# Patient Record
Sex: Female | Born: 1935
Health system: Southern US, Community
[De-identification: ages and names within clinical notes are randomized; demographics above are authoritative.]

## PROBLEM LIST (undated history)

## (undated) DIAGNOSIS — M810 Age-related osteoporosis without current pathological fracture: Secondary | ICD-10-CM

## (undated) DIAGNOSIS — H5060 Mechanical strabismus, unspecified: Secondary | ICD-10-CM

## (undated) DIAGNOSIS — I495 Sick sinus syndrome: Secondary | ICD-10-CM

## (undated) DIAGNOSIS — K449 Diaphragmatic hernia without obstruction or gangrene: Secondary | ICD-10-CM

## (undated) DIAGNOSIS — M25562 Pain in left knee: Secondary | ICD-10-CM

## (undated) DIAGNOSIS — G4730 Sleep apnea, unspecified: Secondary | ICD-10-CM

## (undated) DIAGNOSIS — D801 Nonfamilial hypogammaglobulinemia: Secondary | ICD-10-CM

## (undated) DIAGNOSIS — M1990 Unspecified osteoarthritis, unspecified site: Secondary | ICD-10-CM

## (undated) DIAGNOSIS — J4540 Moderate persistent asthma, uncomplicated: Secondary | ICD-10-CM

## (undated) DIAGNOSIS — I4891 Unspecified atrial fibrillation: Secondary | ICD-10-CM

## (undated) DIAGNOSIS — F329 Major depressive disorder, single episode, unspecified: Secondary | ICD-10-CM

## (undated) DIAGNOSIS — E7800 Pure hypercholesterolemia, unspecified: Secondary | ICD-10-CM

## (undated) DIAGNOSIS — L219 Seborrheic dermatitis, unspecified: Secondary | ICD-10-CM

## (undated) DIAGNOSIS — H3530 Unspecified macular degeneration: Secondary | ICD-10-CM

## (undated) DIAGNOSIS — M26609 Unspecified temporomandibular joint disorder, unspecified side: Secondary | ICD-10-CM

## (undated) DIAGNOSIS — I951 Orthostatic hypotension: Secondary | ICD-10-CM

## (undated) DIAGNOSIS — K219 Gastro-esophageal reflux disease without esophagitis: Secondary | ICD-10-CM

## (undated) DIAGNOSIS — G8929 Other chronic pain: Secondary | ICD-10-CM

## (undated) DIAGNOSIS — J309 Allergic rhinitis, unspecified: Secondary | ICD-10-CM

## (undated) HISTORY — DX: Unspecified osteoarthritis, unspecified site: M19.90

## (undated) HISTORY — DX: Mechanical strabismus, unspecified: H50.60

## (undated) HISTORY — PX: REPLACEMENT TOTAL KNEE: SUR1224

## (undated) HISTORY — DX: Age-related osteoporosis without current pathological fracture: M81.0

## (undated) HISTORY — DX: Gastro-esophageal reflux disease without esophagitis: K21.9

## (undated) HISTORY — DX: Moderate persistent asthma, uncomplicated: J45.40

## (undated) HISTORY — PX: CHOLECYSTECTOMY: SHX55

## (undated) HISTORY — DX: Pure hypercholesterolemia, unspecified: E78.00

## (undated) HISTORY — DX: Sick sinus syndrome: I49.5

## (undated) HISTORY — DX: Other chronic pain: G89.29

## (undated) HISTORY — DX: Orthostatic hypotension: I95.1

## (undated) HISTORY — DX: Major depressive disorder, single episode, unspecified: F32.9

## (undated) HISTORY — PX: APPENDECTOMY: SHX54

## (undated) HISTORY — DX: Sleep apnea, unspecified: G47.30

## (undated) HISTORY — DX: Pain in left knee: M25.562

## (undated) HISTORY — PX: CATARACT EXTRACTION, BILATERAL: SHX1313

## (undated) HISTORY — DX: Unspecified macular degeneration: H35.30

## (undated) HISTORY — DX: Unspecified atrial fibrillation: I48.91

## (undated) HISTORY — DX: Seborrheic dermatitis, unspecified: L21.9

## (undated) HISTORY — DX: Unspecified temporomandibular joint disorder, unspecified side: M26.609

## (undated) HISTORY — PX: HYSTERECTOMY ABDOMINAL WITH SALPINGO-OOPHORECTOMY: SHX6792

## (undated) HISTORY — DX: Diaphragmatic hernia without obstruction or gangrene: K44.9

## (undated) HISTORY — DX: Nonfamilial hypogammaglobulinemia: D80.1

## (undated) HISTORY — DX: Allergic rhinitis, unspecified: J30.9

---

## 2004-04-07 ENCOUNTER — Emergency Department (HOSPITAL_COMMUNITY): Admission: EM | Admit: 2004-04-07 | Discharge: 2004-04-07 | Payer: Self-pay | Admitting: Emergency Medicine

## 2009-07-17 DIAGNOSIS — G4730 Sleep apnea, unspecified: Secondary | ICD-10-CM

## 2009-07-17 DIAGNOSIS — F329 Major depressive disorder, single episode, unspecified: Secondary | ICD-10-CM

## 2009-07-17 DIAGNOSIS — J45909 Unspecified asthma, uncomplicated: Secondary | ICD-10-CM | POA: Insufficient documentation

## 2009-07-17 DIAGNOSIS — J4530 Mild persistent asthma, uncomplicated: Secondary | ICD-10-CM | POA: Insufficient documentation

## 2009-07-17 DIAGNOSIS — J4540 Moderate persistent asthma, uncomplicated: Secondary | ICD-10-CM

## 2009-07-17 DIAGNOSIS — M1990 Unspecified osteoarthritis, unspecified site: Secondary | ICD-10-CM | POA: Insufficient documentation

## 2009-07-17 DIAGNOSIS — E7800 Pure hypercholesterolemia, unspecified: Secondary | ICD-10-CM

## 2009-07-17 DIAGNOSIS — F32A Depression, unspecified: Secondary | ICD-10-CM

## 2009-07-17 HISTORY — DX: Depression, unspecified: F32.A

## 2009-07-17 HISTORY — DX: Moderate persistent asthma, uncomplicated: J45.40

## 2009-07-17 HISTORY — DX: Pure hypercholesterolemia, unspecified: E78.00

## 2009-07-17 HISTORY — DX: Unspecified osteoarthritis, unspecified site: M19.90

## 2009-07-17 HISTORY — DX: Sleep apnea, unspecified: G47.30

## 2009-07-17 HISTORY — DX: Major depressive disorder, single episode, unspecified: F32.9

## 2010-10-07 DIAGNOSIS — I471 Supraventricular tachycardia, unspecified: Secondary | ICD-10-CM | POA: Insufficient documentation

## 2010-10-07 DIAGNOSIS — K219 Gastro-esophageal reflux disease without esophagitis: Secondary | ICD-10-CM

## 2010-10-07 DIAGNOSIS — H5060 Mechanical strabismus, unspecified: Secondary | ICD-10-CM

## 2010-10-07 DIAGNOSIS — F410 Panic disorder [episodic paroxysmal anxiety] without agoraphobia: Secondary | ICD-10-CM | POA: Insufficient documentation

## 2010-10-07 DIAGNOSIS — I341 Nonrheumatic mitral (valve) prolapse: Secondary | ICD-10-CM | POA: Insufficient documentation

## 2010-10-07 DIAGNOSIS — M26609 Unspecified temporomandibular joint disorder, unspecified side: Secondary | ICD-10-CM | POA: Insufficient documentation

## 2010-10-07 DIAGNOSIS — G459 Transient cerebral ischemic attack, unspecified: Secondary | ICD-10-CM | POA: Insufficient documentation

## 2010-10-07 DIAGNOSIS — G5600 Carpal tunnel syndrome, unspecified upper limb: Secondary | ICD-10-CM | POA: Insufficient documentation

## 2010-10-07 DIAGNOSIS — L219 Seborrheic dermatitis, unspecified: Secondary | ICD-10-CM

## 2010-10-07 DIAGNOSIS — E669 Obesity, unspecified: Secondary | ICD-10-CM | POA: Insufficient documentation

## 2010-10-07 DIAGNOSIS — I5189 Other ill-defined heart diseases: Secondary | ICD-10-CM | POA: Insufficient documentation

## 2010-10-07 DIAGNOSIS — R0600 Dyspnea, unspecified: Secondary | ICD-10-CM | POA: Insufficient documentation

## 2010-10-07 DIAGNOSIS — K449 Diaphragmatic hernia without obstruction or gangrene: Secondary | ICD-10-CM

## 2010-10-07 DIAGNOSIS — H9319 Tinnitus, unspecified ear: Secondary | ICD-10-CM | POA: Insufficient documentation

## 2010-10-07 DIAGNOSIS — R519 Headache, unspecified: Secondary | ICD-10-CM | POA: Insufficient documentation

## 2010-10-07 HISTORY — DX: Seborrheic dermatitis, unspecified: L21.9

## 2010-10-07 HISTORY — DX: Mechanical strabismus, unspecified: H50.60

## 2010-10-07 HISTORY — DX: Unspecified temporomandibular joint disorder, unspecified side: M26.609

## 2010-10-07 HISTORY — DX: Gastro-esophageal reflux disease without esophagitis: K21.9

## 2010-10-07 HISTORY — DX: Diaphragmatic hernia without obstruction or gangrene: K44.9

## 2012-12-06 DIAGNOSIS — I4719 Other supraventricular tachycardia: Secondary | ICD-10-CM | POA: Insufficient documentation

## 2012-12-06 DIAGNOSIS — I471 Supraventricular tachycardia: Secondary | ICD-10-CM | POA: Insufficient documentation

## 2014-11-13 DIAGNOSIS — R079 Chest pain, unspecified: Secondary | ICD-10-CM | POA: Insufficient documentation

## 2015-09-17 DIAGNOSIS — Z96651 Presence of right artificial knee joint: Secondary | ICD-10-CM | POA: Insufficient documentation

## 2016-03-03 DIAGNOSIS — I480 Paroxysmal atrial fibrillation: Secondary | ICD-10-CM | POA: Diagnosis not present

## 2016-03-03 DIAGNOSIS — I471 Supraventricular tachycardia: Secondary | ICD-10-CM | POA: Diagnosis not present

## 2016-03-03 DIAGNOSIS — Q048 Other specified congenital malformations of brain: Secondary | ICD-10-CM | POA: Diagnosis not present

## 2016-03-03 DIAGNOSIS — I10 Essential (primary) hypertension: Secondary | ICD-10-CM | POA: Diagnosis not present

## 2016-03-03 DIAGNOSIS — F39 Unspecified mood [affective] disorder: Secondary | ICD-10-CM | POA: Diagnosis not present

## 2016-03-03 DIAGNOSIS — H25092 Other age-related incipient cataract, left eye: Secondary | ICD-10-CM | POA: Diagnosis not present

## 2016-03-03 DIAGNOSIS — Z01810 Encounter for preprocedural cardiovascular examination: Secondary | ICD-10-CM | POA: Diagnosis not present

## 2016-03-05 DIAGNOSIS — I4891 Unspecified atrial fibrillation: Secondary | ICD-10-CM | POA: Diagnosis not present

## 2016-03-05 DIAGNOSIS — R5383 Other fatigue: Secondary | ICD-10-CM | POA: Diagnosis not present

## 2016-03-05 DIAGNOSIS — R0602 Shortness of breath: Secondary | ICD-10-CM | POA: Diagnosis not present

## 2016-03-10 DIAGNOSIS — H353220 Exudative age-related macular degeneration, left eye, stage unspecified: Secondary | ICD-10-CM | POA: Diagnosis not present

## 2016-03-17 DIAGNOSIS — H2522 Age-related cataract, morgagnian type, left eye: Secondary | ICD-10-CM | POA: Diagnosis not present

## 2016-03-17 DIAGNOSIS — H5703 Miosis: Secondary | ICD-10-CM | POA: Diagnosis not present

## 2016-04-27 DIAGNOSIS — J4530 Mild persistent asthma, uncomplicated: Secondary | ICD-10-CM | POA: Diagnosis not present

## 2016-04-27 DIAGNOSIS — J309 Allergic rhinitis, unspecified: Secondary | ICD-10-CM | POA: Diagnosis not present

## 2016-04-29 DIAGNOSIS — F331 Major depressive disorder, recurrent, moderate: Secondary | ICD-10-CM | POA: Diagnosis not present

## 2016-06-18 DIAGNOSIS — E785 Hyperlipidemia, unspecified: Secondary | ICD-10-CM | POA: Diagnosis not present

## 2016-06-18 DIAGNOSIS — I471 Supraventricular tachycardia: Secondary | ICD-10-CM | POA: Diagnosis not present

## 2016-06-18 DIAGNOSIS — Z7901 Long term (current) use of anticoagulants: Secondary | ICD-10-CM | POA: Diagnosis not present

## 2016-06-18 DIAGNOSIS — E669 Obesity, unspecified: Secondary | ICD-10-CM | POA: Diagnosis not present

## 2016-06-18 DIAGNOSIS — Z01810 Encounter for preprocedural cardiovascular examination: Secondary | ICD-10-CM | POA: Diagnosis not present

## 2016-06-18 DIAGNOSIS — I4891 Unspecified atrial fibrillation: Secondary | ICD-10-CM | POA: Diagnosis not present

## 2016-06-18 DIAGNOSIS — F39 Unspecified mood [affective] disorder: Secondary | ICD-10-CM | POA: Diagnosis not present

## 2016-06-18 DIAGNOSIS — G4733 Obstructive sleep apnea (adult) (pediatric): Secondary | ICD-10-CM | POA: Diagnosis not present

## 2016-06-18 DIAGNOSIS — Q048 Other specified congenital malformations of brain: Secondary | ICD-10-CM | POA: Diagnosis not present

## 2016-06-18 DIAGNOSIS — I10 Essential (primary) hypertension: Secondary | ICD-10-CM | POA: Diagnosis not present

## 2016-06-23 DIAGNOSIS — H401410 Capsular glaucoma with pseudoexfoliation of lens, right eye, stage unspecified: Secondary | ICD-10-CM | POA: Diagnosis not present

## 2016-06-23 DIAGNOSIS — H5703 Miosis: Secondary | ICD-10-CM | POA: Diagnosis not present

## 2016-06-23 DIAGNOSIS — H2511 Age-related nuclear cataract, right eye: Secondary | ICD-10-CM | POA: Diagnosis not present

## 2016-07-01 DIAGNOSIS — Z9841 Cataract extraction status, right eye: Secondary | ICD-10-CM | POA: Diagnosis not present

## 2016-07-01 DIAGNOSIS — H3530 Unspecified macular degeneration: Secondary | ICD-10-CM | POA: Diagnosis not present

## 2016-07-01 DIAGNOSIS — H401431 Capsular glaucoma with pseudoexfoliation of lens, bilateral, mild stage: Secondary | ICD-10-CM | POA: Diagnosis not present

## 2016-07-01 DIAGNOSIS — Z961 Presence of intraocular lens: Secondary | ICD-10-CM | POA: Diagnosis not present

## 2016-07-01 DIAGNOSIS — H353220 Exudative age-related macular degeneration, left eye, stage unspecified: Secondary | ICD-10-CM | POA: Diagnosis not present

## 2016-07-01 DIAGNOSIS — Z9842 Cataract extraction status, left eye: Secondary | ICD-10-CM | POA: Diagnosis not present

## 2016-07-09 DIAGNOSIS — F331 Major depressive disorder, recurrent, moderate: Secondary | ICD-10-CM | POA: Diagnosis not present

## 2016-08-24 DIAGNOSIS — I7091 Generalized atherosclerosis: Secondary | ICD-10-CM | POA: Diagnosis not present

## 2016-08-24 DIAGNOSIS — L602 Onychogryphosis: Secondary | ICD-10-CM | POA: Diagnosis not present

## 2016-08-24 DIAGNOSIS — L84 Corns and callosities: Secondary | ICD-10-CM | POA: Diagnosis not present

## 2016-09-02 DIAGNOSIS — H353220 Exudative age-related macular degeneration, left eye, stage unspecified: Secondary | ICD-10-CM | POA: Diagnosis not present

## 2016-09-06 DIAGNOSIS — F331 Major depressive disorder, recurrent, moderate: Secondary | ICD-10-CM | POA: Diagnosis not present

## 2016-09-13 DIAGNOSIS — R400 Somnolence: Secondary | ICD-10-CM | POA: Diagnosis not present

## 2016-09-13 DIAGNOSIS — R799 Abnormal finding of blood chemistry, unspecified: Secondary | ICD-10-CM | POA: Diagnosis not present

## 2016-09-13 DIAGNOSIS — G4733 Obstructive sleep apnea (adult) (pediatric): Secondary | ICD-10-CM | POA: Diagnosis not present

## 2016-09-13 DIAGNOSIS — G2581 Restless legs syndrome: Secondary | ICD-10-CM | POA: Diagnosis not present

## 2016-09-13 DIAGNOSIS — M810 Age-related osteoporosis without current pathological fracture: Secondary | ICD-10-CM | POA: Diagnosis not present

## 2016-09-13 DIAGNOSIS — E785 Hyperlipidemia, unspecified: Secondary | ICD-10-CM | POA: Diagnosis not present

## 2016-09-14 DIAGNOSIS — M810 Age-related osteoporosis without current pathological fracture: Secondary | ICD-10-CM | POA: Diagnosis not present

## 2016-10-15 DIAGNOSIS — F331 Major depressive disorder, recurrent, moderate: Secondary | ICD-10-CM | POA: Diagnosis not present

## 2016-10-17 DIAGNOSIS — G4733 Obstructive sleep apnea (adult) (pediatric): Secondary | ICD-10-CM | POA: Diagnosis not present

## 2016-10-17 DIAGNOSIS — G4761 Periodic limb movement disorder: Secondary | ICD-10-CM | POA: Diagnosis not present

## 2016-10-19 DIAGNOSIS — J4530 Mild persistent asthma, uncomplicated: Secondary | ICD-10-CM | POA: Diagnosis not present

## 2016-10-19 DIAGNOSIS — J309 Allergic rhinitis, unspecified: Secondary | ICD-10-CM | POA: Diagnosis not present

## 2016-10-21 DIAGNOSIS — H401431 Capsular glaucoma with pseudoexfoliation of lens, bilateral, mild stage: Secondary | ICD-10-CM | POA: Diagnosis not present

## 2016-10-21 DIAGNOSIS — Z9842 Cataract extraction status, left eye: Secondary | ICD-10-CM | POA: Diagnosis not present

## 2016-10-21 DIAGNOSIS — Z9841 Cataract extraction status, right eye: Secondary | ICD-10-CM | POA: Diagnosis not present

## 2016-10-21 DIAGNOSIS — H3530 Unspecified macular degeneration: Secondary | ICD-10-CM | POA: Diagnosis not present

## 2016-10-26 DIAGNOSIS — L84 Corns and callosities: Secondary | ICD-10-CM | POA: Diagnosis not present

## 2016-10-26 DIAGNOSIS — I7091 Generalized atherosclerosis: Secondary | ICD-10-CM | POA: Diagnosis not present

## 2016-10-26 DIAGNOSIS — L602 Onychogryphosis: Secondary | ICD-10-CM | POA: Diagnosis not present

## 2016-11-03 DIAGNOSIS — H3530 Unspecified macular degeneration: Secondary | ICD-10-CM | POA: Diagnosis not present

## 2016-11-03 DIAGNOSIS — H353221 Exudative age-related macular degeneration, left eye, with active choroidal neovascularization: Secondary | ICD-10-CM | POA: Diagnosis not present

## 2016-11-03 DIAGNOSIS — H353220 Exudative age-related macular degeneration, left eye, stage unspecified: Secondary | ICD-10-CM | POA: Diagnosis not present

## 2016-11-19 DIAGNOSIS — F331 Major depressive disorder, recurrent, moderate: Secondary | ICD-10-CM | POA: Diagnosis not present

## 2016-12-03 DIAGNOSIS — E559 Vitamin D deficiency, unspecified: Secondary | ICD-10-CM | POA: Diagnosis not present

## 2016-12-03 DIAGNOSIS — R413 Other amnesia: Secondary | ICD-10-CM | POA: Diagnosis not present

## 2016-12-03 DIAGNOSIS — Z789 Other specified health status: Secondary | ICD-10-CM | POA: Diagnosis not present

## 2016-12-03 DIAGNOSIS — F329 Major depressive disorder, single episode, unspecified: Secondary | ICD-10-CM | POA: Diagnosis not present

## 2016-12-03 DIAGNOSIS — E785 Hyperlipidemia, unspecified: Secondary | ICD-10-CM | POA: Diagnosis not present

## 2016-12-03 DIAGNOSIS — G4761 Periodic limb movement disorder: Secondary | ICD-10-CM | POA: Diagnosis not present

## 2016-12-03 DIAGNOSIS — R4189 Other symptoms and signs involving cognitive functions and awareness: Secondary | ICD-10-CM | POA: Diagnosis not present

## 2016-12-03 DIAGNOSIS — G9349 Other encephalopathy: Secondary | ICD-10-CM | POA: Diagnosis not present

## 2016-12-03 DIAGNOSIS — Z8673 Personal history of transient ischemic attack (TIA), and cerebral infarction without residual deficits: Secondary | ICD-10-CM | POA: Diagnosis not present

## 2016-12-03 DIAGNOSIS — R5383 Other fatigue: Secondary | ICD-10-CM | POA: Diagnosis not present

## 2016-12-03 DIAGNOSIS — G250 Essential tremor: Secondary | ICD-10-CM | POA: Diagnosis not present

## 2016-12-03 DIAGNOSIS — F0781 Postconcussional syndrome: Secondary | ICD-10-CM | POA: Diagnosis not present

## 2016-12-09 DIAGNOSIS — R110 Nausea: Secondary | ICD-10-CM | POA: Diagnosis not present

## 2016-12-09 DIAGNOSIS — S0990XA Unspecified injury of head, initial encounter: Secondary | ICD-10-CM | POA: Diagnosis not present

## 2016-12-09 DIAGNOSIS — W19XXXA Unspecified fall, initial encounter: Secondary | ICD-10-CM | POA: Diagnosis not present

## 2016-12-09 DIAGNOSIS — R51 Headache: Secondary | ICD-10-CM | POA: Diagnosis not present

## 2016-12-14 DIAGNOSIS — I471 Supraventricular tachycardia: Secondary | ICD-10-CM | POA: Diagnosis not present

## 2016-12-14 DIAGNOSIS — F39 Unspecified mood [affective] disorder: Secondary | ICD-10-CM | POA: Diagnosis not present

## 2016-12-14 DIAGNOSIS — I4891 Unspecified atrial fibrillation: Secondary | ICD-10-CM | POA: Diagnosis not present

## 2016-12-14 DIAGNOSIS — E669 Obesity, unspecified: Secondary | ICD-10-CM | POA: Diagnosis not present

## 2016-12-14 DIAGNOSIS — Z23 Encounter for immunization: Secondary | ICD-10-CM | POA: Diagnosis not present

## 2016-12-14 DIAGNOSIS — Z0001 Encounter for general adult medical examination with abnormal findings: Secondary | ICD-10-CM | POA: Diagnosis not present

## 2016-12-14 DIAGNOSIS — M810 Age-related osteoporosis without current pathological fracture: Secondary | ICD-10-CM | POA: Diagnosis not present

## 2016-12-14 DIAGNOSIS — M67441 Ganglion, right hand: Secondary | ICD-10-CM | POA: Diagnosis not present

## 2016-12-14 DIAGNOSIS — R748 Abnormal levels of other serum enzymes: Secondary | ICD-10-CM | POA: Diagnosis not present

## 2016-12-14 DIAGNOSIS — Z7901 Long term (current) use of anticoagulants: Secondary | ICD-10-CM | POA: Diagnosis not present

## 2016-12-14 DIAGNOSIS — G4733 Obstructive sleep apnea (adult) (pediatric): Secondary | ICD-10-CM | POA: Diagnosis not present

## 2016-12-14 DIAGNOSIS — E785 Hyperlipidemia, unspecified: Secondary | ICD-10-CM | POA: Diagnosis not present

## 2016-12-16 DIAGNOSIS — H353220 Exudative age-related macular degeneration, left eye, stage unspecified: Secondary | ICD-10-CM | POA: Diagnosis not present

## 2016-12-16 DIAGNOSIS — H50611 Brown's sheath syndrome, right eye: Secondary | ICD-10-CM | POA: Diagnosis not present

## 2016-12-16 DIAGNOSIS — H353110 Nonexudative age-related macular degeneration, right eye, stage unspecified: Secondary | ICD-10-CM | POA: Diagnosis not present

## 2016-12-16 DIAGNOSIS — Z961 Presence of intraocular lens: Secondary | ICD-10-CM | POA: Diagnosis not present

## 2016-12-16 DIAGNOSIS — H52203 Unspecified astigmatism, bilateral: Secondary | ICD-10-CM | POA: Diagnosis not present

## 2016-12-16 DIAGNOSIS — H268 Other specified cataract: Secondary | ICD-10-CM | POA: Diagnosis not present

## 2016-12-16 DIAGNOSIS — H524 Presbyopia: Secondary | ICD-10-CM | POA: Diagnosis not present

## 2016-12-28 DIAGNOSIS — L84 Corns and callosities: Secondary | ICD-10-CM | POA: Diagnosis not present

## 2016-12-28 DIAGNOSIS — L602 Onychogryphosis: Secondary | ICD-10-CM | POA: Diagnosis not present

## 2016-12-28 DIAGNOSIS — I7091 Generalized atherosclerosis: Secondary | ICD-10-CM | POA: Diagnosis not present

## 2017-01-05 DIAGNOSIS — M67442 Ganglion, left hand: Secondary | ICD-10-CM | POA: Diagnosis not present

## 2017-01-05 DIAGNOSIS — M79641 Pain in right hand: Secondary | ICD-10-CM | POA: Diagnosis not present

## 2017-01-05 DIAGNOSIS — R2232 Localized swelling, mass and lump, left upper limb: Secondary | ICD-10-CM | POA: Diagnosis not present

## 2017-01-05 DIAGNOSIS — M19042 Primary osteoarthritis, left hand: Secondary | ICD-10-CM | POA: Diagnosis not present

## 2017-01-05 DIAGNOSIS — M189 Osteoarthritis of first carpometacarpal joint, unspecified: Secondary | ICD-10-CM | POA: Diagnosis not present

## 2017-01-05 DIAGNOSIS — M79642 Pain in left hand: Secondary | ICD-10-CM | POA: Diagnosis not present

## 2017-01-05 DIAGNOSIS — M19041 Primary osteoarthritis, right hand: Secondary | ICD-10-CM | POA: Diagnosis not present

## 2017-01-24 DIAGNOSIS — Z9842 Cataract extraction status, left eye: Secondary | ICD-10-CM | POA: Diagnosis not present

## 2017-01-24 DIAGNOSIS — Z9841 Cataract extraction status, right eye: Secondary | ICD-10-CM | POA: Diagnosis not present

## 2017-01-24 DIAGNOSIS — H353230 Exudative age-related macular degeneration, bilateral, stage unspecified: Secondary | ICD-10-CM | POA: Diagnosis not present

## 2017-01-24 DIAGNOSIS — H401431 Capsular glaucoma with pseudoexfoliation of lens, bilateral, mild stage: Secondary | ICD-10-CM | POA: Diagnosis not present

## 2017-01-26 DIAGNOSIS — M189 Osteoarthritis of first carpometacarpal joint, unspecified: Secondary | ICD-10-CM | POA: Diagnosis not present

## 2017-02-02 DIAGNOSIS — H353220 Exudative age-related macular degeneration, left eye, stage unspecified: Secondary | ICD-10-CM | POA: Diagnosis not present

## 2017-02-08 DIAGNOSIS — F331 Major depressive disorder, recurrent, moderate: Secondary | ICD-10-CM | POA: Diagnosis not present

## 2017-03-08 DIAGNOSIS — L602 Onychogryphosis: Secondary | ICD-10-CM | POA: Diagnosis not present

## 2017-03-08 DIAGNOSIS — Z8673 Personal history of transient ischemic attack (TIA), and cerebral infarction without residual deficits: Secondary | ICD-10-CM | POA: Diagnosis not present

## 2017-03-08 DIAGNOSIS — F0781 Postconcussional syndrome: Secondary | ICD-10-CM | POA: Diagnosis not present

## 2017-03-08 DIAGNOSIS — G9349 Other encephalopathy: Secondary | ICD-10-CM | POA: Diagnosis not present

## 2017-03-08 DIAGNOSIS — Z789 Other specified health status: Secondary | ICD-10-CM | POA: Diagnosis not present

## 2017-03-08 DIAGNOSIS — I7091 Generalized atherosclerosis: Secondary | ICD-10-CM | POA: Diagnosis not present

## 2017-03-08 DIAGNOSIS — R413 Other amnesia: Secondary | ICD-10-CM | POA: Diagnosis not present

## 2017-03-08 DIAGNOSIS — R4189 Other symptoms and signs involving cognitive functions and awareness: Secondary | ICD-10-CM | POA: Diagnosis not present

## 2017-03-08 DIAGNOSIS — R5383 Other fatigue: Secondary | ICD-10-CM | POA: Diagnosis not present

## 2017-03-08 DIAGNOSIS — G4761 Periodic limb movement disorder: Secondary | ICD-10-CM | POA: Diagnosis not present

## 2017-03-08 DIAGNOSIS — F329 Major depressive disorder, single episode, unspecified: Secondary | ICD-10-CM | POA: Diagnosis not present

## 2017-03-08 DIAGNOSIS — G250 Essential tremor: Secondary | ICD-10-CM | POA: Diagnosis not present

## 2017-03-08 DIAGNOSIS — L84 Corns and callosities: Secondary | ICD-10-CM | POA: Diagnosis not present

## 2017-03-09 DIAGNOSIS — R2232 Localized swelling, mass and lump, left upper limb: Secondary | ICD-10-CM | POA: Diagnosis not present

## 2017-03-09 DIAGNOSIS — M79642 Pain in left hand: Secondary | ICD-10-CM | POA: Diagnosis not present

## 2017-03-09 DIAGNOSIS — M1811 Unilateral primary osteoarthritis of first carpometacarpal joint, right hand: Secondary | ICD-10-CM | POA: Diagnosis not present

## 2017-03-09 DIAGNOSIS — M79641 Pain in right hand: Secondary | ICD-10-CM | POA: Diagnosis not present

## 2017-03-23 DIAGNOSIS — I471 Supraventricular tachycardia: Secondary | ICD-10-CM | POA: Diagnosis not present

## 2017-03-23 DIAGNOSIS — I480 Paroxysmal atrial fibrillation: Secondary | ICD-10-CM | POA: Diagnosis not present

## 2017-04-15 DIAGNOSIS — F331 Major depressive disorder, recurrent, moderate: Secondary | ICD-10-CM | POA: Diagnosis not present

## 2017-04-15 DIAGNOSIS — M810 Age-related osteoporosis without current pathological fracture: Secondary | ICD-10-CM | POA: Diagnosis not present

## 2017-04-20 DIAGNOSIS — M810 Age-related osteoporosis without current pathological fracture: Secondary | ICD-10-CM | POA: Diagnosis not present

## 2017-04-25 DIAGNOSIS — H353220 Exudative age-related macular degeneration, left eye, stage unspecified: Secondary | ICD-10-CM | POA: Diagnosis not present

## 2017-04-26 DIAGNOSIS — R2232 Localized swelling, mass and lump, left upper limb: Secondary | ICD-10-CM | POA: Diagnosis not present

## 2017-04-26 DIAGNOSIS — M79642 Pain in left hand: Secondary | ICD-10-CM | POA: Diagnosis not present

## 2017-04-26 DIAGNOSIS — M654 Radial styloid tenosynovitis [de Quervain]: Secondary | ICD-10-CM | POA: Diagnosis not present

## 2017-04-26 DIAGNOSIS — M1811 Unilateral primary osteoarthritis of first carpometacarpal joint, right hand: Secondary | ICD-10-CM | POA: Diagnosis not present

## 2017-04-26 DIAGNOSIS — M79641 Pain in right hand: Secondary | ICD-10-CM | POA: Diagnosis not present

## 2017-05-03 DIAGNOSIS — I480 Paroxysmal atrial fibrillation: Secondary | ICD-10-CM | POA: Diagnosis not present

## 2017-05-04 DIAGNOSIS — J309 Allergic rhinitis, unspecified: Secondary | ICD-10-CM | POA: Diagnosis not present

## 2017-05-04 DIAGNOSIS — J4530 Mild persistent asthma, uncomplicated: Secondary | ICD-10-CM | POA: Diagnosis not present

## 2017-05-05 DIAGNOSIS — I480 Paroxysmal atrial fibrillation: Secondary | ICD-10-CM | POA: Insufficient documentation

## 2017-05-10 DIAGNOSIS — L602 Onychogryphosis: Secondary | ICD-10-CM | POA: Diagnosis not present

## 2017-05-10 DIAGNOSIS — I7091 Generalized atherosclerosis: Secondary | ICD-10-CM | POA: Diagnosis not present

## 2017-05-10 DIAGNOSIS — L84 Corns and callosities: Secondary | ICD-10-CM | POA: Diagnosis not present

## 2017-05-16 DIAGNOSIS — J4520 Mild intermittent asthma, uncomplicated: Secondary | ICD-10-CM | POA: Diagnosis not present

## 2017-05-16 DIAGNOSIS — J3089 Other allergic rhinitis: Secondary | ICD-10-CM | POA: Diagnosis not present

## 2017-05-16 DIAGNOSIS — G4733 Obstructive sleep apnea (adult) (pediatric): Secondary | ICD-10-CM | POA: Diagnosis not present

## 2017-05-16 DIAGNOSIS — G40909 Epilepsy, unspecified, not intractable, without status epilepticus: Secondary | ICD-10-CM | POA: Diagnosis not present

## 2017-05-16 DIAGNOSIS — I10 Essential (primary) hypertension: Secondary | ICD-10-CM | POA: Diagnosis not present

## 2017-05-16 DIAGNOSIS — F39 Unspecified mood [affective] disorder: Secondary | ICD-10-CM | POA: Diagnosis not present

## 2017-05-16 DIAGNOSIS — E669 Obesity, unspecified: Secondary | ICD-10-CM | POA: Diagnosis not present

## 2017-05-16 DIAGNOSIS — I480 Paroxysmal atrial fibrillation: Secondary | ICD-10-CM | POA: Diagnosis not present

## 2017-05-16 DIAGNOSIS — I471 Supraventricular tachycardia: Secondary | ICD-10-CM | POA: Diagnosis not present

## 2017-05-16 DIAGNOSIS — G2581 Restless legs syndrome: Secondary | ICD-10-CM | POA: Diagnosis not present

## 2017-05-16 DIAGNOSIS — E785 Hyperlipidemia, unspecified: Secondary | ICD-10-CM | POA: Diagnosis not present

## 2017-05-16 DIAGNOSIS — M818 Other osteoporosis without current pathological fracture: Secondary | ICD-10-CM | POA: Diagnosis not present

## 2017-05-27 DIAGNOSIS — F331 Major depressive disorder, recurrent, moderate: Secondary | ICD-10-CM | POA: Diagnosis not present

## 2017-07-01 DIAGNOSIS — L821 Other seborrheic keratosis: Secondary | ICD-10-CM | POA: Diagnosis not present

## 2017-07-01 DIAGNOSIS — L249 Irritant contact dermatitis, unspecified cause: Secondary | ICD-10-CM | POA: Diagnosis not present

## 2017-07-01 DIAGNOSIS — C44629 Squamous cell carcinoma of skin of left upper limb, including shoulder: Secondary | ICD-10-CM | POA: Diagnosis not present

## 2017-07-01 DIAGNOSIS — D235 Other benign neoplasm of skin of trunk: Secondary | ICD-10-CM | POA: Diagnosis not present

## 2017-07-14 DIAGNOSIS — H353220 Exudative age-related macular degeneration, left eye, stage unspecified: Secondary | ICD-10-CM | POA: Diagnosis not present

## 2017-07-19 NOTE — Progress Notes (Signed)
Mariah Bryant      ELECTROPHYSIOLOGY OFFICE NOTE  Patient ID: Mariah Bryant, MRN: [id number], DOB/AGE: Sep 06, 1930 84 y.o. Admit date: (Not on file) Date of Consult: 07/20/2017  Primary Physician: Patient, No Pcp Per Primary Cardiologist: new     Mariah Bryant is a 84 y.o. female who is being seen today for the evaluation of atrial fibrillation at her own request     HPI [name] is a 84 y.o. female seen to establish care for atrial fibrillation.  She was diagnosed in [place].  She was recently seen by an EP physician who recommended cryoablation.  Her symptoms have been orthostatic palpitations primarily.  She is lightheaded with standing.  She has fallen numerous times.  She has heat intolerance and shower intolerance.  She wore an event recorder for 30 days in February where she was having daily symptoms.  She had A. fib 1% of the time.  There were no rapid rates.  She has been treated with metoprolol with concerns about up titration because of low blood pressure.   EP notes from 3/9 were reviewed.     She is managed with a combination of apixaban and metoprolol   She has reactive airways disease    DATE TEST EF   3//14 Echo   60-65 %   6/17 Echo   70 % LA normal        Date Cr K Hgb  10/18 0.75 4.8 14.9               Surgical History:  Past Surgical History:  Procedure Laterality Date  . APPENDECTOMY    . CHOLECYSTECTOMY    . HYSTERECTOMY ABDOMINAL WITH SALPINGO-OOPHORECTOMY    . REPLACEMENT TOTAL KNEE       Home Meds: Prior to Admission medications   Not on File    Allergies:  Allergies  Allergen Reactions  . Amoxicillin Rash  . Epinephrine Palpitations  . Shellfish Allergy Anaphylaxis  . Morphine And Related Other (See Comments)    Irregular heart beat  . Atarax [Hydroxyzine] Anxiety  . Biaxin [Clarithromycin] Rash  . Codeine Palpitations  . Doxycycline Rash  . Novocain [Procaine] Palpitations  . Penicillins Rash    Social  History   Socioeconomic History  . Marital status: Widowed    Spouse name: Not on file  . Number of children: Not on file  . Years of education: Not on file  . Highest education level: Not on file  Occupational History  . Not on file  Social Needs  . Financial resource strain: Not on file  . Food insecurity:    Worry: Not on file    Inability: Not on file  . Transportation needs:    Medical: Not on file    Non-medical: Not on file  Tobacco Use  . Smoking status: Never Smoker  . Smokeless tobacco: Never Used  Substance and Sexual Activity  . Alcohol use: Yes    Alcohol/week: 0.6 oz    Types: 1 Glasses of wine per week    Comment: occasionally  . Drug use: Never  . Sexual activity: Not on file  Lifestyle  . Physical activity:    Days per week: Not on file    Minutes per session: Not on file  . Stress: Not on file  Relationships  . Social connections:    Talks on phone: Not on file    Gets together: Not on file    Attends religious service: Not on file  Active member of club or organization: Not on file    Attends meetings of clubs or organizations: Not on file    Relationship status: Not on file  . Intimate partner violence:    Fear of current or ex partner: Not on file    Emotionally abused: Not on file    Physically abused: Not on file    Forced sexual activity: Not on file  Other Topics Concern  . Not on file  Social History Narrative  . Not on file     Family History  Problem Relation Age of Onset  . Stroke Mother   . Cancer Sister   . Cancer Brother    ROS:  Please see the history of present illness.     All other systems reviewed and negative.    Physical Exam: Blood pressure 100/60, pulse 63, height 5' (1.524 m), weight 168 lb (76.2 kg), SpO2 92 %. General: Well developed, well nourished female in no acute distress. Head: Normocephalic, atraumatic, sclera non-icteric, no xanthomas, nares are without discharge. EENT: normal  Lymph Nodes:   none Neck: Negative for carotid bruits. JVD not elevated. Back:without scoliosis kyphosis  Lungs: Clear bilaterally to auscultation without wheezes, rales, or rhonchi. Breathing is unlabored. Heart: RRR with S1 S2. No  murmur . No rubs, or gallops appreciated. Abdomen: Soft, non-tender, non-distended with normoactive bowel sounds. No hepatomegaly. No rebound/guarding. No obvious abdominal masses. Msk:  Strength and tone appear normal for age. Extremities: No clubbing or cyanosis. No  edema.  Distal pedal pulses are 2+ and equal bilaterally. Skin: Warm and Dry Neuro: Alert and oriented X 3. CN III-XII intact Grossly normal sensory and motor function . Psych:  Responds to questions appropriately with a normal affect.      Labs: Cardiac Enzymes No results for input(s): CKTOTAL, CKMB, TROPONINI in the last 72 hours. CBC No results found for: WBC, HGB, HCT, MCV, PLT PROTIME: No results for input(s): LABPROT, INR in the last 72 hours. Chemistry No results for input(s): NA, K, CL, CO2, BUN, CREATININE, CALCIUM, PROT, BILITOT, ALKPHOS, ALT, AST, GLUCOSE in the last 168 hours.  Invalid input(s): LABALBU Lipids No results found for: CHOL, HDL, LDLCALC, TRIG BNP No results found for: PROBNP Thyroid Function Tests: No results for input(s): TSH, T4TOTAL, T3FREE, THYROIDAB in the last 72 hours.  Invalid input(s): FREET3 Miscellaneous No results found for: DDIMER  Radiology/Studies:  No results found.  EKG: Sinus rhythm at 64   16/07/22   Assessment and Plan:   Atrial fibrillation-paroxysmal  Falls  Orthostatic hypotension    The patient has orthostatic hypotension and daily symptoms of palpitations.  When she is wearing her event recorder she had only 1% PVCs with no tachycardia.  I suspect that orthostatic intolerance is the dominant part of her symptom complex.  Hence, we have reviewed the physiology.  We will raise the head of her bed 6 inches.  She will get an abdominal  binder and spanks.  She will get a shower chair and raise head of bed 6 inches.  Have also suggested she get a cane chair  On Anticoagulation;  No bleeding issues on dialysis and  More than 50% of 75 min was spent in counseling related to the above     [name]

## 2017-07-20 ENCOUNTER — Encounter (INDEPENDENT_AMBULATORY_CARE_PROVIDER_SITE_OTHER): Payer: Self-pay

## 2017-07-20 ENCOUNTER — Ambulatory Visit (INDEPENDENT_AMBULATORY_CARE_PROVIDER_SITE_OTHER): Payer: Medicare Other | Admitting: Internal Medicine

## 2017-07-20 ENCOUNTER — Encounter: Payer: Self-pay | Admitting: Internal Medicine

## 2017-07-20 VITALS — BP 100/60 | HR 63 | Ht 60.0 in | Wt 168.0 lb

## 2017-07-20 DIAGNOSIS — I493 Ventricular premature depolarization: Secondary | ICD-10-CM

## 2017-07-20 NOTE — Patient Instructions (Addendum)
Medication Instructions:  Your physician has recommended you make the following change in your medication:   Stop Metoprolol  Labwork: None ordered.  Testing/Procedures: None ordered.  Follow-Up: Your physician recommends that you schedule a follow-up appointment in:  2 months with Dr Caryl Comes  Any Other Special Instructions Will Be Listed Below (If Applicable).  Raise the Head of your bed by 6 inches Begin wearing an abdominal binder- you can find them in most drug stores Please start using a shower chair A chair cane could be helpful as well.  If you need a refill on your cardiac medications before your next appointment, please call your pharmacy.

## 2017-08-08 DIAGNOSIS — H353230 Exudative age-related macular degeneration, bilateral, stage unspecified: Secondary | ICD-10-CM | POA: Diagnosis not present

## 2017-08-08 DIAGNOSIS — Z9842 Cataract extraction status, left eye: Secondary | ICD-10-CM | POA: Diagnosis not present

## 2017-08-08 DIAGNOSIS — H401431 Capsular glaucoma with pseudoexfoliation of lens, bilateral, mild stage: Secondary | ICD-10-CM | POA: Diagnosis not present

## 2017-08-08 DIAGNOSIS — Z9841 Cataract extraction status, right eye: Secondary | ICD-10-CM | POA: Diagnosis not present

## 2017-08-15 ENCOUNTER — Encounter: Payer: Self-pay | Admitting: Family Medicine

## 2017-08-15 ENCOUNTER — Ambulatory Visit (INDEPENDENT_AMBULATORY_CARE_PROVIDER_SITE_OTHER)
Admission: RE | Admit: 2017-08-15 | Discharge: 2017-08-15 | Disposition: A | Discharge status: Home / Self Care | Payer: Medicare Other | Source: Ambulatory Visit | Attending: Family Medicine | Admitting: Family Medicine

## 2017-08-15 ENCOUNTER — Ambulatory Visit (INDEPENDENT_AMBULATORY_CARE_PROVIDER_SITE_OTHER): Payer: Medicare Other | Admitting: Family Medicine

## 2017-08-15 VITALS — BP 110/68 | HR 79 | Temp 97.7°F | Ht 60.0 in | Wt 165.0 lb

## 2017-08-15 DIAGNOSIS — K449 Diaphragmatic hernia without obstruction or gangrene: Secondary | ICD-10-CM | POA: Diagnosis not present

## 2017-08-15 DIAGNOSIS — I341 Nonrheumatic mitral (valve) prolapse: Secondary | ICD-10-CM

## 2017-08-15 DIAGNOSIS — M25562 Pain in left knee: Secondary | ICD-10-CM | POA: Diagnosis not present

## 2017-08-15 DIAGNOSIS — R1032 Left lower quadrant pain: Secondary | ICD-10-CM

## 2017-08-15 DIAGNOSIS — M545 Low back pain, unspecified: Secondary | ICD-10-CM

## 2017-08-15 DIAGNOSIS — I700 Atherosclerosis of aorta: Secondary | ICD-10-CM

## 2017-08-15 DIAGNOSIS — J4530 Mild persistent asthma, uncomplicated: Secondary | ICD-10-CM | POA: Diagnosis not present

## 2017-08-15 DIAGNOSIS — F32A Depression, unspecified: Secondary | ICD-10-CM

## 2017-08-15 DIAGNOSIS — I4719 Other supraventricular tachycardia: Secondary | ICD-10-CM

## 2017-08-15 DIAGNOSIS — M810 Age-related osteoporosis without current pathological fracture: Secondary | ICD-10-CM | POA: Diagnosis not present

## 2017-08-15 DIAGNOSIS — G4733 Obstructive sleep apnea (adult) (pediatric): Secondary | ICD-10-CM

## 2017-08-15 DIAGNOSIS — Z8673 Personal history of transient ischemic attack (TIA), and cerebral infarction without residual deficits: Secondary | ICD-10-CM

## 2017-08-15 DIAGNOSIS — I495 Sick sinus syndrome: Secondary | ICD-10-CM

## 2017-08-15 DIAGNOSIS — I471 Supraventricular tachycardia, unspecified: Secondary | ICD-10-CM

## 2017-08-15 DIAGNOSIS — K219 Gastro-esophageal reflux disease without esophagitis: Secondary | ICD-10-CM

## 2017-08-15 DIAGNOSIS — M1991 Primary osteoarthritis, unspecified site: Secondary | ICD-10-CM

## 2017-08-15 DIAGNOSIS — H5060 Mechanical strabismus, unspecified: Secondary | ICD-10-CM | POA: Diagnosis not present

## 2017-08-15 DIAGNOSIS — H353231 Exudative age-related macular degeneration, bilateral, with active choroidal neovascularization: Secondary | ICD-10-CM

## 2017-08-15 DIAGNOSIS — M4726 Other spondylosis with radiculopathy, lumbar region: Secondary | ICD-10-CM

## 2017-08-15 DIAGNOSIS — M1612 Unilateral primary osteoarthritis, left hip: Secondary | ICD-10-CM | POA: Diagnosis not present

## 2017-08-15 DIAGNOSIS — F329 Major depressive disorder, single episode, unspecified: Secondary | ICD-10-CM

## 2017-08-15 DIAGNOSIS — E7800 Pure hypercholesterolemia, unspecified: Secondary | ICD-10-CM

## 2017-08-15 DIAGNOSIS — M26609 Unspecified temporomandibular joint disorder, unspecified side: Secondary | ICD-10-CM

## 2017-08-15 DIAGNOSIS — M1712 Unilateral primary osteoarthritis, left knee: Secondary | ICD-10-CM | POA: Diagnosis not present

## 2017-08-15 DIAGNOSIS — I951 Orthostatic hypotension: Secondary | ICD-10-CM

## 2017-08-15 DIAGNOSIS — I480 Paroxysmal atrial fibrillation: Secondary | ICD-10-CM

## 2017-08-15 DIAGNOSIS — D801 Nonfamilial hypogammaglobulinemia: Secondary | ICD-10-CM

## 2017-08-15 DIAGNOSIS — G8929 Other chronic pain: Secondary | ICD-10-CM

## 2017-08-15 DIAGNOSIS — L219 Seborrheic dermatitis, unspecified: Secondary | ICD-10-CM

## 2017-08-15 DIAGNOSIS — J309 Allergic rhinitis, unspecified: Secondary | ICD-10-CM | POA: Insufficient documentation

## 2017-08-15 HISTORY — DX: Allergic rhinitis, unspecified: J30.9

## 2017-08-15 HISTORY — DX: Nonfamilial hypogammaglobulinemia: D80.1

## 2017-08-15 MED ORDER — TRAMADOL HCL 50 MG PO TABS: 50.0000 mg | ORAL_TABLET | Freq: Three times a day (TID) | ORAL | 0 refills | Status: DC | PRN

## 2017-08-15 NOTE — Progress Notes (Signed)
Mariah Bryant is a 84 y.o. female here for an acute visit.  History of Present Illness:   Mariah Bryant CMA acting as scribe for Dr. Juleen China.  HPI: Patient comes in today for an acute problem.   Patient is new to me, presenting for an acute visit. Complains of left groin pain, with radiation to back and down back of leg. History of osteoporosis, osteoarthritis. Has had a RIGHT knee replacement. Moved recently to live with daughter. Has been unloading boxes but denies lifting anything too heavy. Bedroom is currently upstairs, but the family is working on converting an area on the first floor for her. Pain has worsened over the past week and keeping her up at night. Tylenol not helpful. Aleve somewhat helpful.The worst pain is located mid-inguinal area on left. She states that she felt a "lump." History of fall in 1998, resulting in a large hematoma of the left hip. No imaging in years. On Prolia for OP.   PMHx, SurgHx, SocialHx, Medications, and Allergies were reviewed in the Visit Navigator and updated as appropriate.  Current Medications:   Current Outpatient Medications:  .  albuterol (PROVENTIL HFA;VENTOLIN HFA) 108 (90 Base) MCG/ACT inhaler, Inhale 2 puffs into the lungs every 4 (four) hours as needed for wheezing or shortness of breath., Disp: , Rfl:  .  apixaban (ELIQUIS) 5 MG TABS tablet, Take 5 mg by mouth 2 (two) times daily., Disp: , Rfl:  .  budesonide (PULMICORT) 180 MCG/ACT inhaler, Inhale 2 puffs into the lungs 2 (two) times daily., Disp: , Rfl:  .  cholecalciferol (VITAMIN D) 1000 units tablet, Take 1,000 Units by mouth daily., Disp: , Rfl:  .  cyanocobalamin 1000 MCG tablet, Take 1,000 mcg by mouth daily., Disp: , Rfl:  .  denosumab (PROLIA) 60 MG/ML SOSY injection, Inject 60 mg into the skin every 6 (six) months., Disp: , Rfl:  .  escitalopram (LEXAPRO) 20 MG tablet, Take 20 mg by mouth daily., Disp: , Rfl:  .  fluticasone (FLONASE) 50 MCG/ACT nasal spray, Place 2 sprays  into both nostrils 2 (two) times daily as needed for allergies or rhinitis., Disp: , Rfl:  .  folic acid (FOLVITE) 626 MCG tablet, Take 400 mcg by mouth daily., Disp: , Rfl:  .  Multiple Vitamins-Minerals (PRESERVISION AREDS 2 PO), Take 1 tablet by mouth 2 (two) times daily., Disp: , Rfl:  .  traMADol (ULTRAM) 50 MG tablet, Take 1 tablet (50 mg total) by mouth every 8 (eight) hours as needed., Disp: 30 tablet, Rfl: 0   Allergies  Allergen Reactions  . Amoxicillin Rash  . Epinephrine Palpitations  . Shellfish Allergy Anaphylaxis  . Morphine And Related Other (See Comments)    Irregular heart beat  . Atarax [Hydroxyzine] Anxiety  . Biaxin [Clarithromycin] Rash  . Codeine Palpitations  . Doxycycline Rash  . Novocain [Procaine] Palpitations  . Penicillins Rash   Review of Systems:   Pertinent items are noted in the HPI. Otherwise, ROS is negative.  Vitals:   Vitals:   08/15/17 0912  BP: 110/68  Pulse: 79  Temp: 97.7 F (36.5 C)  TempSrc: Oral  SpO2: 95%  Weight: 165 lb (74.8 kg)  Height: 5' (1.524 m)     Body mass index is 32.22 kg/m.  Physical Exam:   Physical Exam  Constitutional: She appears well-nourished.  HENT:  Head: Normocephalic and atraumatic.  Eyes: Pupils are equal, round, and reactive to light. EOM are normal.  Neck: Normal range of motion. Neck  supple.  Cardiovascular: Normal rate, regular rhythm, normal heart sounds and intact distal pulses.  Pulmonary/Chest: Effort normal.  Abdominal: Soft.  Musculoskeletal:       Left hip: She exhibits decreased range of motion and bony tenderness.       Left knee: Tenderness found. Medial joint line and lateral joint line tenderness noted.       Lumbar back: She exhibits bony tenderness.  Skin: Skin is warm.  Psychiatric: She has a normal mood and affect. Her behavior is normal.  Nursing note and vitals reviewed.  Left Knee: Moderate to severe degenerative change centered on the medial joint compartment with  mild to moderate changes of the patellofemoral and lateral compartments. No acute bony abnormality.  Left Hip: Mild osteoarthritic joint space loss of the left hip. No acute bony abnormality.  Lumbar: No acute abnormality of the lumbar spine. Degenerative changes in the lower lumbar spine, primarily at L4-5 and L5-S1.  Assessment and Plan:   [name] was seen today for leg pain.  Diagnoses and all orders for this visit:  Left inguinal pain -     traMADol (ULTRAM) 50 MG tablet; Take 1 tablet (50 mg total) by mouth every 8 (eight) hours as needed. -     DG HIP UNILAT W OR W/O PELVIS 2-3 VIEWS LEFT; Future  Chronic left-sided low back pain without sciatica -     DG Lumbar Spine 2-3 Views; Future  Chronic pain of left knee -     DG Knee 1-2 Views Left; Future  History of TIA (transient ischemic attack)  Mild persistent asthma without complication  Hiatal hernia  Obstructive sleep apnea syndrome  Gastro-esophageal reflux disease without esophagitis  Brown's syndrome  Primary osteoarthritis, unspecified site  Osteoporosis without current pathological fracture, unspecified osteoporosis type  Seborrheic dermatitis  Temporomandibular joint disorder  Depressive disorder  Hypercholesterolemia  Supraventricular tachycardia (HCC)  Sick sinus syndrome (HCC)  Mitral valve prolapse  Atrial paroxysmal tachycardia (HCC)  Hypogammaglobulinemia (HCC)  Exudative age-related macular degeneration of both eyes with active choroidal neovascularization (HCC)  Orthostatic intolerance  Aortic atherosclerosis (HCC)  Osteoarthritis of spine with radiculopathy, lumbar region    . Reviewed expectations re: course of current medical issues. . Discussed self-management of symptoms. . Outlined signs and symptoms indicating need for more acute intervention. . Patient verbalized understanding and all questions were answered. [name] Health Maintenance issues including appropriate healthy  diet, exercise, and smoking avoidance were discussed with patient. . See orders for this visit as documented in the electronic medical record. . Patient received an After Visit Summary.  CMA served as Education administrator during this visit. History, Physical, and Plan performed by medical provider. The above documentation has been reviewed and is accurate and complete. Briscoe Deutscher, D.O.  Briscoe Deutscher, DO [place] 08/16/2017   Records requested if needed. Time spent with the patient: 45 minutes, of which >50% was spent in obtaining information about her symptoms, reviewing her previous labs, evaluations, and treatments, counseling her about her condition (please see the discussed topics above), and developing a plan to further investigate it; she had a number of questions which I addressed.

## 2017-08-15 NOTE — Patient Instructions (Addendum)
Take half tablet of tramadol for pain at night.  Okay to take Aleve and Tylenol for pain.

## 2017-08-16 ENCOUNTER — Encounter: Payer: Self-pay | Admitting: Family Medicine

## 2017-08-16 ENCOUNTER — Telehealth: Payer: Self-pay

## 2017-08-16 DIAGNOSIS — H3530 Unspecified macular degeneration: Secondary | ICD-10-CM

## 2017-08-16 DIAGNOSIS — M545 Low back pain, unspecified: Secondary | ICD-10-CM | POA: Insufficient documentation

## 2017-08-16 DIAGNOSIS — G8929 Other chronic pain: Secondary | ICD-10-CM | POA: Insufficient documentation

## 2017-08-16 DIAGNOSIS — M47816 Spondylosis without myelopathy or radiculopathy, lumbar region: Secondary | ICD-10-CM | POA: Insufficient documentation

## 2017-08-16 DIAGNOSIS — M25562 Pain in left knee: Secondary | ICD-10-CM

## 2017-08-16 DIAGNOSIS — I951 Orthostatic hypotension: Secondary | ICD-10-CM | POA: Insufficient documentation

## 2017-08-16 DIAGNOSIS — M810 Age-related osteoporosis without current pathological fracture: Secondary | ICD-10-CM | POA: Insufficient documentation

## 2017-08-16 DIAGNOSIS — I700 Atherosclerosis of aorta: Secondary | ICD-10-CM | POA: Insufficient documentation

## 2017-08-16 HISTORY — DX: Orthostatic hypotension: I95.1

## 2017-08-16 HISTORY — DX: Other chronic pain: G89.29

## 2017-08-16 HISTORY — DX: Age-related osteoporosis without current pathological fracture: M81.0

## 2017-08-16 HISTORY — DX: Unspecified macular degeneration: H35.30

## 2017-08-16 NOTE — Telephone Encounter (Signed)
Dr. Charlett Blake is not accepting new patients at this time.  Please offer patient other providers here in the office [place], Nani Ravens, [place]

## 2017-08-16 NOTE — Telephone Encounter (Signed)
Copied from [place] [id number]. Topic: General - Other >> Aug 12, 2017  2:44 PM Carolyn Stare wrote:   Pt is asking if Dr Charlett Blake would except her a new pt said Dr  Olin Hauser Bensimhon    [phone]

## 2017-08-16 NOTE — Telephone Encounter (Signed)
Called and left pt a message that Dr. Charlett Blake isn't accepting new patients and let her know the other providers that are accepting new patients.

## 2017-08-18 ENCOUNTER — Other Ambulatory Visit: Payer: Self-pay

## 2017-08-18 DIAGNOSIS — M1710 Unilateral primary osteoarthritis, unspecified knee: Secondary | ICD-10-CM

## 2017-08-18 NOTE — Progress Notes (Signed)
Mariah Bryant is a 84 y.o. female is here for follow up.  History of Present Illness:   HPI:  Patient in office to establish care. All records were reviewed with patient in office.   Pain: See note from last week. Pain was in knee. Xrays were taken in office that showed degenerative changes. Tramadol given for moderate pain but did not help. Kept awake due to pain.  She was on medication for restless legs but was taken off in the bast by neurology. If she has any problems with restless legs in the future to call our office to talk about options.   She was also on Lamictal but has been off of it for a few months ago. She also would like to start coming off Lexapro.   She also had questions about vitamins she has been on for her eyes. She was advised to ask the ophthalmology if they recommend her to keep taking.     Toe pain. Right third toe pain for several years. Had evaluation several years ago and was told their was nothing that you could do. She would like to have referral to have evaluated.   Health Maintenance Due  Topic Date Due  . DEXA SCAN  06/26/1997   Depression screen PHQ 2/9 08/15/2017  Decreased Interest 0  Down, Depressed, Hopeless 0  PHQ - 2 Score 0   PMHx, SurgHx, SocialHx, FamHx, Medications, and Allergies were reviewed in the Visit Navigator and updated as appropriate.   Patient Active Problem List   Diagnosis Date Noted  . RLS (restless legs syndrome) 08/27/2017  . Chronic toe pain, right third toe 08/27/2017  . 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 Macular degeneration of both eyes 08/27/2017  . Difficulty walking 08/27/2017  . Hearing loss 08/19/2017  . Osteoporosis, on Prolia 08/16/2017  . Chronic pain of left knee 08/16/2017  . Chronic left-sided low back pain without sciatica 08/16/2017  . Macular degeneration 08/16/2017  . Orthostatic intolerance 08/16/2017  . Aortic atherosclerosis ([place])  08/16/2017  . Degenerative arthritis of lumbar spine 08/16/2017  . Allergic rhinitis 08/15/2017  . PAF (paroxysmal atrial fibrillation) ([place]) 05/05/2017  . Status post total right knee replacement 09/17/2015  . Atrial paroxysmal tachycardia ([place]) 12/06/2012  . 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 Hiatal hernia 10/07/2010  . Mitral valve prolapse 10/07/2010  . Seborrheic dermatitis 10/07/2010  . Temporomandibular joint disorder, right side 10/07/2010  . Mild persistent asthma, on daily Pulmicort 07/17/2009  . Depressive disorder, on Lexapro 07/17/2009  . Hypercholesterolemia, no statin 07/17/2009  . Osteoarthritis 07/17/2009   Social History   Tobacco Use  . Smoking status: Never Smoker  . Smokeless tobacco: Never Used  Substance Use Topics  . Alcohol use: Yes    Alcohol/week: 0.6 oz    Types: 1 Glasses of wine per week    Comment: occasionally  . Drug use: Never   Current Medications and Allergies:   .  albuterol (PROVENTIL HFA;VENTOLIN HFA) 108 (90 Base) MCG/ACT inhaler, Inhale 2 puffs into the lungs every 4 (four) hours as needed for wheezing or shortness of breath., Disp: , Rfl:  .  apixaban (ELIQUIS) 5 MG TABS tablet, Take 5 mg by mouth 2 (two) times daily., Disp: , Rfl:  .  budesonide (PULMICORT) 180 MCG/ACT inhaler, Inhale 2 puffs into the lungs 2 (two) times daily., Disp: , Rfl:  .  cholecalciferol (VITAMIN D) 1000 units tablet, Take 1,000 Units by mouth  daily., Disp: , Rfl:  .  cyanocobalamin 1000 MCG tablet, Take 1,000 mcg by mouth daily., Disp: , Rfl:  .  denosumab (PROLIA) 60 MG/ML SOSY injection, Inject 60 mg into the skin every 6 (six) months., Disp: , Rfl:  .  escitalopram (LEXAPRO) 20 MG tablet, Take 20 mg by mouth daily., Disp: , Rfl:  .  fluticasone (FLONASE) 50 MCG/ACT nasal spray, Place 2 sprays into both nostrils 2 (two) times daily as needed for allergies or rhinitis., Disp: , Rfl:  .  folic acid (FOLVITE) 106 MCG tablet, Take 400 mcg by mouth daily., Disp: , Rfl:   .  Multiple Vitamins-Minerals (PRESERVISION AREDS 2 PO), Take 1 tablet by mouth 2 (two) times daily., Disp: , Rfl:  .  traMADol (ULTRAM) 50 MG tablet, Take 1 tablet (50 mg total) by mouth every 8 (eight) hours as needed., Disp: 30 tablet, Rfl: 0   Allergies  Allergen Reactions  . Amoxicillin Rash  . Epinephrine Palpitations  . Shellfish Allergy Anaphylaxis  . Morphine And Related Other (See Comments)    Irregular heart beat  . Atarax [Hydroxyzine] Anxiety  . Biaxin [Clarithromycin] Rash  . Codeine Palpitations  . Doxycycline Rash  . Novocain [Procaine] Palpitations  . Penicillins Rash   Review of Systems   Pertinent items are noted in the HPI. Otherwise, ROS is negative.  Vitals:   Vitals:   08/19/17 1506  BP: 100/68  Pulse: 93  Temp: 98.3 F (36.8 C)  TempSrc: Oral  SpO2: 94%  Weight: 163 lb (73.9 kg)  Height: 5' (1.524 m)     Body mass index is 31.83 kg/m.  Physical Exam:   Physical Exam  Constitutional: She appears well-nourished.  HENT:  Head: Normocephalic and atraumatic.  Eyes: Pupils are equal, round, and reactive to light. EOM are normal.  Neck: Normal range of motion. Neck supple.  Cardiovascular: Normal rate, regular rhythm, normal heart sounds and intact distal pulses.  Pulmonary/Chest: Effort normal.  Abdominal: Soft.  Skin: Skin is warm.  Psychiatric: She has a normal mood and affect. Her behavior is normal.  Nursing note and vitals reviewed.  Assessment and Plan:   Franshesca was seen today for establish care.  Diagnoses and all orders for this visit:  Osteoporosis, on Prolia Comments: Due for DEXA.  Orders: -     DG Bone Density; Future  Bilateral hearing loss, unspecified hearing loss type Comments: Will refer to Audiology.  Difficulty walking Comments: Poor balance. Interested in [place]. Will refer.  Orders: -     Ambulatory referral to Physical Therapy  Low back pain without sciatica Comments: Will ask PMR or Orthopedics to see  if injections are a possiblility for treatment.  Orders: -     Ambulatory referral to Physical Medicine Rehab  Hammer toe of right foot Comments: With irritated skin and worsening deformity. Will ask Orthopedics to evaluate. Suspect amputation to be discussed.  Orders: -     Ambulatory referral to Orthopedic Surgery  RLS (restless legs syndrome) Comments: Hx of. Previously on Requip but taken off by Neurology. Will monitor symptoms.   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    . Reviewed expectations re: course of current medical issues. . Discussed self-management of symptoms. . Outlined signs and symptoms indicating need for more acute intervention. [name]  Patient verbalized understanding and all questions were answered. [name] Health Maintenance issues including appropriate healthy diet, exercise, and smoking avoidance were discussed with patient. . See orders for this visit as documented in the electronic medical record. . Patient received an After Visit Summary.  Briscoe Deutscher, DO [place] 08/27/2017  Future Appointments  Date Time Provider [place]  09/13/2017  4:00 PM Deboraha Sprang, MD [facility]  11/23/2017  3:40 PM Briscoe Deutscher, DO [facility]    CMA served as scribe during this visit. History, Physical, and Plan performed by medical provider. The above documentation has been reviewed and is accurate and complete. Briscoe Deutscher, D.O.  Records requested if needed. Time spent with the patient: 40 minutes, of which >50% was spent in obtaining information about her symptoms, reviewing her previous labs, evaluations, and treatments, counseling her about her condition (please see the discussed topics  above), and developing a plan to further investigate it; she had a number of questions which I addressed.

## 2017-08-18 NOTE — Progress Notes (Signed)
Referral

## 2017-08-19 ENCOUNTER — Ambulatory Visit (INDEPENDENT_AMBULATORY_CARE_PROVIDER_SITE_OTHER): Payer: Medicare Other | Admitting: Family Medicine

## 2017-08-19 ENCOUNTER — Encounter: Payer: Self-pay | Admitting: Family Medicine

## 2017-08-19 VITALS — BP 100/68 | HR 93 | Temp 98.3°F | Ht 60.0 in | Wt 163.0 lb

## 2017-08-19 DIAGNOSIS — M2041 Other hammer toe(s) (acquired), right foot: Secondary | ICD-10-CM | POA: Diagnosis not present

## 2017-08-19 DIAGNOSIS — G2581 Restless legs syndrome: Secondary | ICD-10-CM | POA: Diagnosis not present

## 2017-08-19 DIAGNOSIS — G8929 Other chronic pain: Secondary | ICD-10-CM

## 2017-08-19 DIAGNOSIS — M810 Age-related osteoporosis without current pathological fracture: Secondary | ICD-10-CM | POA: Diagnosis not present

## 2017-08-19 DIAGNOSIS — H401431 Capsular glaucoma with pseudoexfoliation of lens, bilateral, mild stage: Secondary | ICD-10-CM | POA: Diagnosis not present

## 2017-08-19 DIAGNOSIS — I480 Paroxysmal atrial fibrillation: Secondary | ICD-10-CM | POA: Diagnosis not present

## 2017-08-19 DIAGNOSIS — M79674 Pain in right toe(s): Secondary | ICD-10-CM | POA: Diagnosis not present

## 2017-08-19 DIAGNOSIS — R262 Difficulty in walking, not elsewhere classified: Secondary | ICD-10-CM | POA: Diagnosis not present

## 2017-08-19 DIAGNOSIS — M545 Low back pain, unspecified: Secondary | ICD-10-CM

## 2017-08-19 DIAGNOSIS — H9193 Unspecified hearing loss, bilateral: Secondary | ICD-10-CM | POA: Diagnosis not present

## 2017-08-19 DIAGNOSIS — H353230 Exudative age-related macular degeneration, bilateral, stage unspecified: Secondary | ICD-10-CM

## 2017-08-19 DIAGNOSIS — H9190 Unspecified hearing loss, unspecified ear: Secondary | ICD-10-CM | POA: Insufficient documentation

## 2017-08-19 NOTE — Patient Instructions (Addendum)
You have an appointment scheduled for: []   2D Mammogram  []   3D Mammogram  [x]   Bone Density   On            At    Your appointment will at the following location  [x]   [facility]      [place], [place]         []   [place], [place] Instructions on stopping the Lexapro:  Start taking 1/2 tablet for two weeks then take 1/2 tablet every other day for two weeks then stop.

## 2017-08-25 ENCOUNTER — Telehealth: Payer: Self-pay

## 2017-08-25 NOTE — Telephone Encounter (Signed)
I think that this may have already been addressed? I said Mariah Bryant but to make sure that she is seen for all issues.

## 2017-08-25 NOTE — Telephone Encounter (Signed)
Copied from [place] [id number]. Topic: Referral - Question >> Aug 23, 2017 10:36 AM Synthia Innocent wrote: Reason for CRM: 3 referrals are in for [facility], would like to know which referral is needed? Please advise  >> Aug 23, 2017 12:59 PM Jasper Loser, CMA wrote: Mariah Bryant and spoke with Mariah Bryant at [facility] and was advised that providers there prefer that pt see one doctor. They ask that provider specify if pt should be seeing Dr. Sharol Given, Dr. Ernestina Patches, or Dr. Durward Fortes for knee pain, LBP, and hammer toe. Once this has been done, contact Mariah Bryant at [place] (option 1) or update referral.

## 2017-08-26 NOTE — Telephone Encounter (Signed)
See note can you fix referrals?

## 2017-08-27 ENCOUNTER — Encounter: Payer: Self-pay | Admitting: Family Medicine

## 2017-08-27 DIAGNOSIS — G8929 Other chronic pain: Secondary | ICD-10-CM | POA: Insufficient documentation

## 2017-08-27 DIAGNOSIS — M79674 Pain in right toe(s): Secondary | ICD-10-CM

## 2017-08-27 DIAGNOSIS — G2581 Restless legs syndrome: Secondary | ICD-10-CM | POA: Insufficient documentation

## 2017-08-27 DIAGNOSIS — H401431 Capsular glaucoma with pseudoexfoliation of lens, bilateral, mild stage: Secondary | ICD-10-CM | POA: Insufficient documentation

## 2017-08-27 DIAGNOSIS — R262 Difficulty in walking, not elsewhere classified: Secondary | ICD-10-CM | POA: Insufficient documentation

## 2017-08-27 DIAGNOSIS — H3530 Unspecified macular degeneration: Secondary | ICD-10-CM | POA: Insufficient documentation

## 2017-09-13 ENCOUNTER — Ambulatory Visit: Payer: Medicare Other | Admitting: Internal Medicine

## 2017-09-14 ENCOUNTER — Institutional Professional Consult (permissible substitution) (INDEPENDENT_AMBULATORY_CARE_PROVIDER_SITE_OTHER): Payer: Self-pay | Admitting: Physical Medicine and Rehabilitation

## 2017-10-17 ENCOUNTER — Ambulatory Visit: Payer: Medicare Other | Admitting: Family Medicine

## 2017-10-21 ENCOUNTER — Ambulatory Visit (INDEPENDENT_AMBULATORY_CARE_PROVIDER_SITE_OTHER)
Admission: RE | Admit: 2017-10-21 | Discharge: 2017-10-21 | Disposition: A | Discharge status: Home / Self Care | Payer: Medicare Other | Source: Ambulatory Visit | Attending: Family Medicine | Admitting: Family Medicine

## 2017-10-21 DIAGNOSIS — M810 Age-related osteoporosis without current pathological fracture: Secondary | ICD-10-CM

## 2017-10-24 ENCOUNTER — Ambulatory Visit (INDEPENDENT_AMBULATORY_CARE_PROVIDER_SITE_OTHER): Payer: Medicare Other | Admitting: Physical Therapy

## 2017-10-24 ENCOUNTER — Other Ambulatory Visit: Payer: Self-pay

## 2017-10-24 DIAGNOSIS — M25552 Pain in left hip: Secondary | ICD-10-CM

## 2017-10-24 DIAGNOSIS — R262 Difficulty in walking, not elsewhere classified: Secondary | ICD-10-CM

## 2017-10-24 DIAGNOSIS — R29898 Other symptoms and signs involving the musculoskeletal system: Secondary | ICD-10-CM | POA: Diagnosis not present

## 2017-10-24 NOTE — Therapy (Signed)
[facility] [address] [place], [place], [address] Phone: [phone]   Fax:  [phone]  Physical Therapy Evaluation  Patient Details  Name: Mariah Bryant MRN: [id number] Date of Birth: 10-May-1930 Referring Provider: Dr. Juleen China   Encounter Date: 10/24/2017  PT End of Session - 10/24/17 1142    Visit Number  1    Number of Visits  12    Date for PT Re-Evaluation  12/05/17    PT Start Time  1058    PT Stop Time  1139    PT Time Calculation (min)  41 min    Activity Tolerance  Patient tolerated treatment well    Behavior During Therapy  [facility] for tasks assessed/performed       Past Medical History:  Diagnosis Date  . Allergic rhinitis 08/15/2017  . Asthma, stable, moderate persistent 07/17/2009  . Atrial fibrillation ([place])   . 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 Chronic pain of left knee 08/16/2017  . Depressive disorder 07/17/2009  . Gastro-esophageal reflux disease without esophagitis 10/07/2010  . Hiatal hernia 10/07/2010  . Hypercholesterolemia 07/17/2009  . Hypogammaglobulinemia ([place]) 08/15/2017  . Macular degeneration 08/16/2017  . Orthostatic intolerance 08/16/2017  . Osteoarthritis 07/17/2009  . Osteoporosis 08/16/2017  . Seborrheic dermatitis 10/07/2010  . Sleep apnea 07/17/2009  . Temporomandibular joint disorder 10/07/2010    Past Surgical History:  Procedure Laterality Date  . APPENDECTOMY    . CATARACT EXTRACTION, BILATERAL    . CHOLECYSTECTOMY    . HYSTERECTOMY ABDOMINAL WITH SALPINGO-OOPHORECTOMY    . REPLACEMENT TOTAL KNEE      There were no vitals filed for this visit.   Subjective Assessment - 10/24/17 1055    Subjective  Patient reporting L groin and buttock pain; fell down some cement stairs many years ago. feels like balance is little off. Recent cortisone shot in L knee - pain relief, but feels like knee is going to buckle. pain increases with prolonged walking    Pertinent History  R TKA    Patient Stated Goals   "get rid of pain in L hip/buttock" "I want to be able to bend down on my knees"    Currently in Pain?  Yes    Pain Score  3     Pain Location  Hip    Pain Orientation  Left    Pain Descriptors / Indicators  Aching;Dull    Pain Type  Chronic pain    Pain Onset  More than a month ago    Pain Frequency  Intermittent         [facility] PT Assessment - 10/24/17 1104      Assessment   Medical Diagnosis  difficulty walking    Referring Provider  Dr. Juleen China    Next MD Visit  --   unsure   Prior Therapy  not for this issue      Precautions   Precautions  None      Restrictions   Weight Bearing Restrictions  No      Balance Screen   Has the patient fallen in the past 6 months  No   last fall in October   Has the patient had a decrease in activity level because of a fear of falling?   Yes    Is the patient reluctant to leave their home because of a fear of falling?   No      Home Film/video editor residence    Living Arrangements  Alone;Children    Type of Home  Apartment    Home Access  Stairs to enter    Entrance Stairs-Number of Steps  15    Entrance Stairs-Rails  Right;Left;Can reach both    Home Layout  One level      Prior Function   Level of Independence  Independent    Vocation  Retired      Associate Professor   Overall Cognitive Status  Within Functional Limits for tasks assessed      Sensation   Light Touch  Appears Intact      Coordination   Gross Motor Movements are Fluid and Coordinated  Yes      Posture/Postural Control   Posture/Postural Control  Postural limitations    Postural Limitations  Rounded Shoulders;Forward head      ROM / Strength   AROM / PROM / Strength  AROM;PROM;Strength      AROM   Overall AROM   Within functional limits for tasks performed      PROM   Overall PROM   Deficits    PROM Assessment Site  Hip    Right/Left Hip  Left    Left Hip External Rotation   --   tightness   Left Hip Internal Rotation   --    tightness     Strength   Strength Assessment Site  Hip;Knee    Right/Left Hip  Right;Left    Right Hip Flexion  4-/5    Left Hip Flexion  4-/5    Left Hip ABduction  3+/5    Left Hip ADduction  4-/5    Right/Left Knee  Right;Left    Right Knee Flexion  4+/5    Right Knee Extension  4+/5    Left Knee Flexion  4/5    Left Knee Extension  4-/5      Flexibility   Soft Tissue Assessment /Muscle Length  yes    Hamstrings  mild tightness    ITB  mild tightness    Piriformis  severe tightness      Palpation   Palpation comment  TTP at L hip flexor group, L greater trochanter, L ITB insertion, L glute region      Ambulation/Gait   Gait Comments  L hip drop, slight limp throughout                Objective measurements completed on examination: See above findings.              PT Education - 10/24/17 1141    Education Details  exam findings and POC, HEP review    Person(s) Educated  Patient    Methods  Explanation;Demonstration;Handout    Comprehension  Verbalized understanding;Returned demonstration       PT Short Term Goals - 10/24/17 1146      PT SHORT TERM GOAL #1   Title  patient to be independent with initial HEP    Status  New    Target Date  11/14/17      PT SHORT TERM GOAL #2   Title  patient to improve L hip internal/external rotation by 5-10 degress for greater mobility     Status  New    Target Date  11/14/17        PT Long Term Goals - 10/24/17 1147      PT LONG TERM GOAL #1   Title  patient to be independent with final HEP    Status  New    Target  Date  12/05/17      PT LONG TERM GOAL #2   Title  patient to improve L hip strength to >/= 4/5 in all planes    Status  New    Target Date  12/05/17      PT LONG TERM GOAL #3   Title  patient to report ability to ambulate for >/=1 hour without increase in L hip pain/discomfort    Status  New    Target Date  12/05/17      PT LONG TERM GOAL #4   Title  patient to report pain  levels no greater than 2/10 for greater than 2 weeks    Status  New    Target Date  12/05/17             Plan - 10/24/17 1143    Clinical Impression Statement  Mariah Bryant is a very pleasant 84 y/o female presenting to [place] today regarding primary complaints of L sided hip/groin pain impacting her balance, gait and functional mobility. Patient reporting no knonw mechanism of injury, other than a fall many years ago. Patient today presenting with restricted PROM at L hip at internal/external rotation, TTP at greater trochanter and ITB insertion as well as general weakness of L Hip musculature as compared to R side. Patient to benefit from skilled PT services to address the above listed deficits to allow for improved functional mobility.     Clinical Presentation  Stable    Clinical Decision Making  Low    Rehab Potential  Good    PT Frequency  2x / week    PT Duration  6 weeks    PT Treatment/Interventions  ADLs/Self Care Home Management;Cryotherapy;Electrical Stimulation;Iontophoresis 4mg /ml Dexamethasone;Moist Heat;Therapeutic exercise;Therapeutic activities;Functional mobility training;Stair training;Gait training;Ultrasound;Balance training;Neuromuscular re-education;Patient/family education;Manual techniques;Vasopneumatic Device;Taping;Dry needling;Passive range of motion    Consulted and Agree with Plan of Care  Patient       Patient will benefit from skilled therapeutic intervention in order to improve the following deficits and impairments:  Abnormal gait, Decreased activity tolerance, Decreased balance, Decreased range of motion, Decreased mobility, Difficulty walking, Decreased strength, Pain  Visit Diagnosis: Pain in left hip - Plan: PT plan of care cert/re-cert  Difficulty in walking, not elsewhere classified - Plan: PT plan of care cert/re-cert  Other symptoms and signs involving the musculoskeletal system - Plan: PT plan of care cert/re-cert     Problem  List Patient Active Problem List   Diagnosis Date Noted  . RLS (restless legs syndrome) 08/27/2017  . Chronic toe pain, right third toe 08/27/2017  . 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 Macular degeneration of both eyes 08/27/2017  . Difficulty walking 08/27/2017  . Hearing loss, with bilateral [facility] aids [facility] 08/19/2017  . Osteoporosis, on Prolia 08/16/2017  . Chronic pain of left knee 08/16/2017  . Chronic left-sided low back pain without sciatica 08/16/2017  . Macular degeneration 08/16/2017  . Orthostatic intolerance 08/16/2017  . Aortic atherosclerosis ([place]) 08/16/2017  . Degenerative arthritis of lumbar spine 08/16/2017  . Allergic rhinitis, on Flonase prn 08/15/2017  . PAF (paroxysmal atrial fibrillation) ([place]) 05/05/2017  . Status post total right knee replacement 09/17/2015  . Atrial paroxysmal tachycardia ([place]) 12/06/2012  . Brown's syndrome, with double vision 10/07/2010  . Hiatal hernia, without GERD 10/07/2010  . Mitral valve prolapse 10/07/2010  . Seborrheic dermatitis, controlled with [place] 10/07/2010  . Temporomandibular joint disorder, right side 10/07/2010  .  Mild persistent asthma, on daily Pulmicort 07/17/2009  . Depressive disorder, on Lexapro 07/17/2009  . Hypercholesterolemia, no statin 07/17/2009  . Osteoarthritis 07/17/2009    Lanney Gins, PT, DPT 10/24/17 11:51 AM Pager: [phone]   [facility] [place], [place], [address] Phone: [phone]   Fax:  [phone]  Name: Mariah Bryant MRN: [id number] Date of Birth: May 25, 1930

## 2017-10-25 ENCOUNTER — Institutional Professional Consult (permissible substitution) (INDEPENDENT_AMBULATORY_CARE_PROVIDER_SITE_OTHER): Payer: Self-pay | Admitting: Physical Medicine and Rehabilitation

## 2017-10-26 ENCOUNTER — Other Ambulatory Visit: Payer: Medicare Other

## 2017-11-01 ENCOUNTER — Ambulatory Visit: Payer: Medicare Other | Admitting: Internal Medicine

## 2017-11-07 ENCOUNTER — Telehealth: Payer: Self-pay | Admitting: Family Medicine

## 2017-11-07 NOTE — Telephone Encounter (Signed)
See note about prolia and contact [facility] to schedule PT appointment.  Copied from [place] [phone]. Topic: Appointment Scheduling - Scheduling Inquiry for Clinic >> Nov 07, 2017  1:35 PM Bea Graff, NT wrote: Reason for CRM: Daughter called to set mom up for her prolia injection as well as an appt for PT. Please call daughter to schedule.

## 2017-11-07 NOTE — Telephone Encounter (Signed)
Called pt to schedule PT and prolia. Left vm for pt to call back.

## 2017-11-09 ENCOUNTER — Ambulatory Visit (INDEPENDENT_AMBULATORY_CARE_PROVIDER_SITE_OTHER): Payer: Medicare Other | Admitting: Physician Assistant

## 2017-11-09 ENCOUNTER — Encounter: Payer: Self-pay | Admitting: Physician Assistant

## 2017-11-09 VITALS — BP 110/64 | HR 67 | Temp 98.0°F | Ht 60.0 in | Wt 157.5 lb

## 2017-11-09 DIAGNOSIS — R229 Localized swelling, mass and lump, unspecified: Secondary | ICD-10-CM | POA: Diagnosis not present

## 2017-11-09 NOTE — Progress Notes (Signed)
Mariah Bryant is a 84 y.o. female here for a new problem.  I acted as a Education administrator for [employer], PA-C Anselmo Pickler, LPN  History of Present Illness:   Chief Complaint  Patient presents with  . Mass   Daughter present with patient.  HPI  Nodule Pt noticed a knot above left eye few days ago, above her eyebrow. Denies pain. She hasn't noticed any significant change since she first noticed it. She states that she has a puppy and it jumped on her eye a few days ago, and this event coincides with when she noticed the bump. Denies recent URI, fevers, or similar areas of concern.  Past Medical History:  Diagnosis Date  . Allergic rhinitis 08/15/2017  . Asthma, stable, moderate persistent 07/17/2009  . Atrial fibrillation ([place])   . 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 Chronic pain of left knee 08/16/2017  . Depressive disorder 07/17/2009  . Gastro-esophageal reflux disease without esophagitis 10/07/2010  . Hiatal hernia 10/07/2010  . Hypercholesterolemia 07/17/2009  . Hypogammaglobulinemia ([place]) 08/15/2017  . Macular degeneration 08/16/2017  . Orthostatic intolerance 08/16/2017  . Osteoarthritis 07/17/2009  . Osteoporosis 08/16/2017  . Seborrheic dermatitis 10/07/2010  . Sleep apnea 07/17/2009  . Temporomandibular joint disorder 10/07/2010     Social History   Socioeconomic History  . Marital status: Widowed    Spouse name: Not on file  . Number of children: Not on file  . Years of education: Not on file  . Highest education level: Not on file  Occupational History  . Not on file  Social Needs  . Financial resource strain: Not on file  . Food insecurity:    Worry: Not on file    Inability: Not on file  . Transportation needs:    Medical: Not on file    Non-medical: Not on file  Tobacco Use  . Smoking status: Never Smoker  . Smokeless tobacco: Never Used  Substance and Sexual Activity  . Alcohol use: Yes    Alcohol/week: 1.0 standard drinks    Types: 1 Glasses of wine per week   Comment: occasionally  . Drug use: Never  . Sexual activity: Not on file  Lifestyle  . Physical activity:    Days per week: Not on file    Minutes per session: Not on file  . Stress: Not on file  Relationships  . Social connections:    Talks on phone: Not on file    Gets together: Not on file    Attends religious service: Not on file    Active member of club or organization: Not on file    Attends meetings of clubs or organizations: Not on file    Relationship status: Not on file  . Intimate partner violence:    Fear of current or ex partner: Not on file    Emotionally abused: Not on file    Physically abused: Not on file    Forced sexual activity: Not on file  Other Topics Concern  . Not on file  Social History Narrative   Lives with daughter and family. Has upstairs suite at the moment. They are working on getting her to the first floor.     Past Surgical History:  Procedure Laterality Date  . APPENDECTOMY    . CATARACT EXTRACTION, BILATERAL    . CHOLECYSTECTOMY    . HYSTERECTOMY ABDOMINAL WITH SALPINGO-OOPHORECTOMY    . REPLACEMENT TOTAL KNEE      Family History  Problem Relation Age of Onset  .  Stroke Mother   . Cancer Sister   . Cancer Brother     Allergies  Allergen Reactions  . Amoxicillin Rash  . Epinephrine Palpitations  . Shellfish Allergy Anaphylaxis  . Morphine And Related Other (See Comments)    Irregular heart beat  . Atarax [Hydroxyzine] Anxiety  . Biaxin [Clarithromycin] Rash  . Codeine Palpitations  . Doxycycline Rash  . Novocain [Procaine] Palpitations  . Penicillins Rash    Current Medications:   Current Outpatient Medications:  .  albuterol (PROVENTIL HFA;VENTOLIN HFA) 108 (90 Base) MCG/ACT inhaler, Inhale 2 puffs into the lungs every 4 (four) hours as needed for wheezing or shortness of breath., Disp: , Rfl:  .  apixaban (ELIQUIS) 5 MG TABS tablet, Take 5 mg by mouth 2 (two) times daily., Disp: , Rfl:  .  budesonide (PULMICORT) 180  MCG/ACT inhaler, Inhale 2 puffs into the lungs 2 (two) times daily., Disp: , Rfl:  .  cholecalciferol (VITAMIN D) 1000 units tablet, Take 1,000 Units by mouth daily., Disp: , Rfl:  .  cyanocobalamin 1000 MCG tablet, Take 1,000 mcg by mouth daily., Disp: , Rfl:  .  denosumab (PROLIA) 60 MG/ML SOSY injection, Inject 60 mg into the skin every 6 (six) months., Disp: , Rfl:  .  escitalopram (LEXAPRO) 20 MG tablet, Take 20 mg by mouth daily., Disp: , Rfl:  .  fluticasone (FLONASE) 50 MCG/ACT nasal spray, Place 2 sprays into both nostrils 2 (two) times daily as needed for allergies or rhinitis., Disp: , Rfl:  .  Multiple Vitamins-Minerals (PRESERVISION AREDS 2 PO), Take 1 tablet by mouth 2 (two) times daily., Disp: , Rfl:  .  traMADol (ULTRAM) 50 MG tablet, Take 1 tablet (50 mg total) by mouth every 8 (eight) hours as needed. (Patient not taking: Reported on 11/09/2017), Disp: 30 tablet, Rfl: 0   Review of Systems:   ROS  Negative unless otherwise specified per HPI.  Vitals:   Vitals:   11/09/17 1026  BP: 110/64  Pulse: 67  Temp: 98 F (36.7 C)  TempSrc: Oral  SpO2: 98%  Weight: 157 lb 8 oz (71.4 kg)  Height: 5' (1.524 m)     Body mass index is 30.76 kg/m.  Physical Exam:   Physical Exam  Constitutional: She appears well-developed. She is cooperative.  Non-toxic appearance. She does not have a sickly appearance. She does not appear ill. No distress.  HENT:  Head: Normocephalic and atraumatic.    Right Ear: Tympanic membrane, external ear and ear canal normal. Tympanic membrane is not erythematous, not retracted and not bulging.  Left Ear: Tympanic membrane, external ear and ear canal normal. Tympanic membrane is not erythematous, not retracted and not bulging.  Nose: Nose normal. Right sinus exhibits no maxillary sinus tenderness and no frontal sinus tenderness. Left sinus exhibits no maxillary sinus tenderness and no frontal sinus tenderness.  Mouth/Throat: Uvula is midline and  mucous membranes are normal. No posterior oropharyngeal edema or posterior oropharyngeal erythema. Tonsils are 0 on the right. Tonsils are 0 on the left.  Eyes: Conjunctivae and lids are normal.  Neck: Trachea normal.  Cardiovascular: Normal rate, regular rhythm, S1 normal, S2 normal and normal heart sounds.  Pulmonary/Chest: Effort normal and breath sounds normal. She has no decreased breath sounds. She has no wheezes. She has no rhonchi. She has no rales.  Lymphadenopathy:    She has no cervical adenopathy.  Neurological: She is alert.  Skin: Skin is warm, dry and intact.  Psychiatric: She has  a normal mood and affect. Her speech is normal and behavior is normal.  Nursing note and vitals reviewed.   Assessment and Plan:    V[place]a was seen today for mass.  Diagnoses and all orders for this visit:  Skin nodule   Small raised area of skin above L eyebrow. No red flags on exam. Does not appear infected. Recommended watchful waiting, may try cool compress. Suspect irritation/inflammation from recent dog landing on her face. Follow-up in 1 week if symptoms worsen or persist. Patient and daughter agreeable to plan.  . Reviewed expectations re: course of current medical issues. . Discussed self-management of symptoms. . Outlined signs and symptoms indicating need for more acute intervention. . Patient verbalized understanding and all questions were answered. . See orders for this visit as documented in the electronic medical record. . Patient received an After-Visit Summary.  CMA or LPN served as scribe during this visit. History, Physical, and Plan performed by medical provider. The above documentation has been reviewed and is accurate and complete.  Inda Coke, PA-C

## 2017-11-09 NOTE — Patient Instructions (Signed)
It was great to see you!  If your symptoms persist after 1 week, please return for further evaluation.  Take care,  Inda Coke PA-C

## 2017-11-10 ENCOUNTER — Institutional Professional Consult (permissible substitution) (INDEPENDENT_AMBULATORY_CARE_PROVIDER_SITE_OTHER): Payer: Self-pay | Admitting: Physical Medicine and Rehabilitation

## 2017-11-14 ENCOUNTER — Ambulatory Visit (INDEPENDENT_AMBULATORY_CARE_PROVIDER_SITE_OTHER): Payer: Medicare Other | Admitting: Physical Therapy

## 2017-11-14 ENCOUNTER — Encounter: Payer: Self-pay | Admitting: Physical Therapy

## 2017-11-14 DIAGNOSIS — M25552 Pain in left hip: Secondary | ICD-10-CM

## 2017-11-14 DIAGNOSIS — R2689 Other abnormalities of gait and mobility: Secondary | ICD-10-CM

## 2017-11-14 NOTE — Therapy (Signed)
[facility] [address] [place], [place], [address] Phone: [phone]   Fax:  [phone]  Physical Therapy Treatment  Patient Details  Name: Mariah Bryant MRN: [id number] Date of Birth: 26-Oct-1930 Referring Provider: Dr. Juleen China   Encounter Date: 11/14/2017  PT End of Session - 11/14/17 1117    Visit Number  2    Number of Visits  12    Date for PT Re-Evaluation  12/05/17    PT Start Time  1110    PT Stop Time  1150    PT Time Calculation (min)  40 min    Activity Tolerance  Patient tolerated treatment well    Behavior During Therapy  [facility] for tasks assessed/performed       Past Medical History:  Diagnosis Date  . Allergic rhinitis 08/15/2017  . Asthma, stable, moderate persistent 07/17/2009  . Atrial fibrillation ([place])   . 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 Chronic pain of left knee 08/16/2017  . Depressive disorder 07/17/2009  . Gastro-esophageal reflux disease without esophagitis 10/07/2010  . Hiatal hernia 10/07/2010  . Hypercholesterolemia 07/17/2009  . Hypogammaglobulinemia ([place]) 08/15/2017  . Macular degeneration 08/16/2017  . Orthostatic intolerance 08/16/2017  . Osteoarthritis 07/17/2009  . Osteoporosis 08/16/2017  . Seborrheic dermatitis 10/07/2010  . Sleep apnea 07/17/2009  . Temporomandibular joint disorder 10/07/2010    Past Surgical History:  Procedure Laterality Date  . APPENDECTOMY    . CATARACT EXTRACTION, BILATERAL    . CHOLECYSTECTOMY    . HYSTERECTOMY ABDOMINAL WITH SALPINGO-OOPHORECTOMY    . REPLACEMENT TOTAL KNEE      There were no vitals filed for this visit.  Subjective Assessment - 11/14/17 1115    Subjective  Pt states soreness in groin. She didnt do much of HEP bc she lost her sheet. Soreness with initial standing/walking.     Currently in Pain?  Yes    Pain Score  3     Pain Location  Groin    Pain Orientation  Left    Pain Descriptors / Indicators  Aching    Pain Type  Chronic pain    Pain Onset   More than a month ago    Pain Frequency  Intermittent    Multiple Pain Sites  Yes    Pain Score  3    Pain Location  Hip   SI/ Glute   Pain Orientation  Posterior    Pain Descriptors / Indicators  Sore;Tightness    Pain Type  Chronic pain    Pain Onset  More than a month ago    Pain Frequency  Intermittent                       [facility] Adult PT Treatment/Exercise - 11/14/17 1119      Ambulation/Gait   Gait Comments  35 ft x8;       Exercises   Exercises  Knee/Hip      Lumbar Exercises: Stretches   Single Knee to Chest Stretch  --      Knee/Hip Exercises: Stretches   Other Knee/Hip Stretches  SKTC 30 sec x3 bil;     Other Knee/Hip Stretches  HIp ER butterfly stretch 10 sec x3; Lower trunk rotation 10 [place] x10;       Knee/Hip Exercises: Aerobic   Stationary Bike  unable due to R knee ROM;       Knee/Hip Exercises: Supine   Other Supine Knee/Hip Exercises  Clam GTB x20;  Knee/Hip Exercises: Prone   Hamstring Curl  5 reps      Manual Therapy   Manual Therapy  Joint mobilization;Passive ROM;Soft tissue mobilization    Joint Mobilization  L hip, long leg distraction 10 sec x5;  Posterior hip mobs ;     Soft tissue mobilization  STM to L glute with roller    Passive ROM  L Hip, flexion, Rotation; Extension; manual L quad stretch ;              PT Education - 11/14/17 1200    Education Details  HEP review    Person(s) Educated  Patient    Methods  Explanation;Handout;Demonstration    Comprehension  Verbalized understanding;Need further instruction       PT Short Term Goals - 10/24/17 1146      PT SHORT TERM GOAL #1   Title  patient to be independent with initial HEP    Status  New    Target Date  11/14/17      PT SHORT TERM GOAL #2   Title  patient to improve L hip internal/external rotation by 5-10 degress for greater mobility     Status  New    Target Date  11/14/17        PT Long Term Goals - 10/24/17 1147      PT LONG TERM GOAL  #1   Title  patient to be independent with final HEP    Status  New    Target Date  12/05/17      PT LONG TERM GOAL #2   Title  patient to improve L hip strength to >/= 4/5 in all planes    Status  New    Target Date  12/05/17      PT LONG TERM GOAL #3   Title  patient to report ability to ambulate for >/=1 hour without increase in L hip pain/discomfort    Status  New    Target Date  12/05/17      PT LONG TERM GOAL #4   Title  patient to report pain levels no greater than 2/10 for greater than 2 weeks    Status  New    Target Date  12/05/17            Plan - 11/14/17 1202    Clinical Impression Statement  Pt with soreness and tenderness in L SI and glute med region today with palaption, and with STM. She has significant stiffness in L hip, that is likely contributing to poor gait mechanics and pain. Pt also with pain in groin with initial standing and movement. Ther ex today focused on stretching, and reviewed HEP. Pt to benefit from improving ROM and strength of L hip. Plan to progress as tolerated .    Rehab Potential  Good    PT Frequency  2x / week    PT Duration  6 weeks    PT Treatment/Interventions  ADLs/Self Care Home Management;Cryotherapy;Electrical Stimulation;Iontophoresis 4mg /ml Dexamethasone;Moist Heat;Therapeutic exercise;Therapeutic activities;Functional mobility training;Stair training;Gait training;Ultrasound;Balance training;Neuromuscular re-education;Patient/family education;Manual techniques;Vasopneumatic Device;Taping;Dry needling;Passive range of motion    Consulted and Agree with Plan of Care  Patient       Patient will benefit from skilled therapeutic intervention in order to improve the following deficits and impairments:  Abnormal gait, Decreased activity tolerance, Decreased balance, Decreased range of motion, Decreased mobility, Difficulty walking, Decreased strength, Pain  Visit Diagnosis: Pain in left hip  Other abnormalities of gait and  mobility  Problem List Patient Active Problem List   Diagnosis Date Noted  . RLS (restless legs syndrome) 08/27/2017  . Chronic toe pain, right third toe 08/27/2017  . 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 Macular degeneration of both eyes 08/27/2017  . Difficulty walking 08/27/2017  . Hearing loss, with bilateral hearing aids obtained from [facility] 08/19/2017  . Osteoporosis, on Prolia 08/16/2017  . Chronic pain of left knee 08/16/2017  . Chronic left-sided low back pain without sciatica 08/16/2017  . Macular degeneration 08/16/2017  . Orthostatic intolerance 08/16/2017  . Aortic atherosclerosis ([place]) 08/16/2017  . Degenerative arthritis of lumbar spine 08/16/2017  . Allergic rhinitis, on Flonase prn 08/15/2017  . PAF (paroxysmal atrial fibrillation) ([place]) 05/05/2017  . Status post total right knee replacement 09/17/2015  . Atrial paroxysmal tachycardia ([place]) 12/06/2012  . Brown's syndrome, with double vision 10/07/2010  . Hiatal hernia, without GERD 10/07/2010  . Mitral valve prolapse 10/07/2010  . Seborrheic dermatitis, controlled with Aloe 10/07/2010  . Temporomandibular joint disorder, right side 10/07/2010  . Mild persistent asthma, on daily Pulmicort 07/17/2009  . Depressive disorder, on Lexapro 07/17/2009  . Hypercholesterolemia, no statin 07/17/2009  . Osteoarthritis 07/17/2009    Lyndee Hensen, PT, DPT 12:06 PM  11/14/17    [facility] [place], [place], [address] Phone: [phone]   Fax:  [phone]  Name: Ravina Milner MRN: [id number] Date of Birth: [birth year]

## 2017-11-14 NOTE — Patient Instructions (Signed)
Access Code: [email or web address]  URL: [email or web address]  Date: 11/14/2017  Prepared by: Lyndee Hensen   Exercises  Supine Single Knee to Chest Stretch - 3 reps - 30 hold - 2x daily  Supine Lower Trunk Rotation - 1 sets - 10 hold - 2x daily  Bent Knee Fallouts - 5 reps - 10 hold - 2x daily  Hooklying Isometric Clamshell - 10 reps - 2 sets - 2x daily

## 2017-11-15 ENCOUNTER — Telehealth: Payer: Self-pay

## 2017-11-15 NOTE — Telephone Encounter (Signed)
Called and left a voicemail message asking for a return phone call. I received her authorization for Prolia injection. Looks like her cost are estimated to be around $10. Please schedule patient when she returns phone call.

## 2017-11-16 ENCOUNTER — Telehealth: Payer: Self-pay | Admitting: Family Medicine

## 2017-11-16 ENCOUNTER — Encounter: Payer: Medicare Other | Admitting: Physical Therapy

## 2017-11-16 ENCOUNTER — Encounter: Payer: Self-pay | Admitting: Physical Therapy

## 2017-11-16 ENCOUNTER — Ambulatory Visit (INDEPENDENT_AMBULATORY_CARE_PROVIDER_SITE_OTHER): Payer: Medicare Other | Admitting: Physical Therapy

## 2017-11-16 DIAGNOSIS — M25552 Pain in left hip: Secondary | ICD-10-CM | POA: Diagnosis not present

## 2017-11-16 DIAGNOSIS — R2689 Other abnormalities of gait and mobility: Secondary | ICD-10-CM

## 2017-11-16 NOTE — Therapy (Signed)
[facility] [address] [place], [place], [address] Phone: [phone]   Fax:  [phone]  Physical Therapy Treatment  Patient Details  Name: Mariah Bryant MRN: [id number] Date of Birth: 1930-04-07 Referring Provider: Dr. Juleen China   Encounter Date: 11/16/2017  PT End of Session - 11/16/17 1432    Visit Number  3    Number of Visits  12    Date for PT Re-Evaluation  12/05/17    PT Start Time  1346    PT Stop Time  1430    PT Time Calculation (min)  44 min    Activity Tolerance  Patient tolerated treatment well    Behavior During Therapy  [facility] for tasks assessed/performed       Past Medical History:  Diagnosis Date  . Allergic rhinitis 08/15/2017  . Asthma, stable, moderate persistent 07/17/2009  . Atrial fibrillation ([place])   . 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 Chronic pain of left knee 08/16/2017  . Depressive disorder 07/17/2009  . Gastro-esophageal reflux disease without esophagitis 10/07/2010  . Hiatal hernia 10/07/2010  . Hypercholesterolemia 07/17/2009  . Hypogammaglobulinemia ([place]) 08/15/2017  . Macular degeneration 08/16/2017  . Orthostatic intolerance 08/16/2017  . Osteoarthritis 07/17/2009  . Osteoporosis 08/16/2017  . Seborrheic dermatitis 10/07/2010  . Sleep apnea 07/17/2009  . Temporomandibular joint disorder 10/07/2010    Past Surgical History:  Procedure Laterality Date  . APPENDECTOMY    . CATARACT EXTRACTION, BILATERAL    . CHOLECYSTECTOMY    . HYSTERECTOMY ABDOMINAL WITH SALPINGO-OOPHORECTOMY    . REPLACEMENT TOTAL KNEE      There were no vitals filed for this visit.  Subjective Assessment - 11/16/17 1430    Subjective  No new complaints    Currently in Pain?  Yes    Pain Score  3     Pain Location  Groin    Pain Orientation  Left    Pain Descriptors / Indicators  Aching    Pain Type  Chronic pain    Pain Onset  More than a month ago    Pain Frequency  Intermittent    Pain Score  3    Pain Location  Hip    Pain Orientation  Posterior    Pain Descriptors / Indicators  Sore;Tightness    Pain Type  Chronic pain    Pain Onset  More than a month ago    Pain Frequency  Intermittent                       [facility] Adult PT Treatment/Exercise - 11/16/17 1350      Ambulation/Gait   Gait Comments  35 ft x8;       Exercises   Exercises  Knee/Hip      Knee/Hip Exercises: Stretches   Active Hamstring Stretch  3 reps;30 seconds    Other Knee/Hip Stretches  SKTC 30 sec x3 bil;     Other Knee/Hip Stretches  HIp ER butterfly stretch 10 sec x3; Lower trunk rotation 10 [place] x10;       Knee/Hip Exercises: Standing   Hip Abduction  10 reps;2 sets;Both      Knee/Hip Exercises: Supine   Other Supine Knee/Hip Exercises  --      Knee/Hip Exercises: Prone   Hamstring Curl  --      Manual Therapy   Manual Therapy  Joint mobilization;Passive ROM;Soft tissue mobilization    Joint Mobilization  L hip, long leg distraction 10 sec x5;  Posterior hip mobs ;     Soft tissue mobilization  STM to L glute with roller and manual     Passive ROM  L Hip, flexion, Rotation; Extension; manual L quad stretch ;                PT Short Term Goals - 10/24/17 1146      PT SHORT TERM GOAL #1   Title  patient to be independent with initial HEP    Status  New    Target Date  11/14/17      PT SHORT TERM GOAL #2   Title  patient to improve L hip internal/external rotation by 5-10 degress for greater mobility     Status  New    Target Date  11/14/17        PT Long Term Goals - 10/24/17 1147      PT LONG TERM GOAL #1   Title  patient to be independent with final HEP    Status  New    Target Date  12/05/17      PT LONG TERM GOAL #2   Title  patient to improve L hip strength to >/= 4/5 in all planes    Status  New    Target Date  12/05/17      PT LONG TERM GOAL #3   Title  patient to report ability to ambulate for >/=1 hour without increase in L hip pain/discomfort    Status  New    Target  Date  12/05/17      PT LONG TERM GOAL #4   Title  patient to report pain levels no greater than 2/10 for greater than 2 weeks    Status  New    Target Date  12/05/17            Plan - 11/16/17 1433    Clinical Impression Statement  Pt with significant tenderness and soreness at L glute today, with light STM. Pt with limited ability for stretching of hip rotators, due to stiffness in hip and inability to get hip into rotation positions. Pt with no increased pain with progression of ther ex today. Plan to progress as tolerated.     Rehab Potential  Good    PT Frequency  2x / week    PT Duration  6 weeks    PT Treatment/Interventions  ADLs/Self Care Home Management;Cryotherapy;Electrical Stimulation;Iontophoresis 4mg /ml Dexamethasone;Moist Heat;Therapeutic exercise;Therapeutic activities;Functional mobility training;Stair training;Gait training;Ultrasound;Balance training;Neuromuscular re-education;Patient/family education;Manual techniques;Vasopneumatic Device;Taping;Dry needling;Passive range of motion    Consulted and Agree with Plan of Care  Patient       Patient will benefit from skilled therapeutic intervention in order to improve the following deficits and impairments:  Abnormal gait, Decreased activity tolerance, Decreased balance, Decreased range of motion, Decreased mobility, Difficulty walking, Decreased strength, Pain  Visit Diagnosis: Pain in left hip  Other abnormalities of gait and mobility     Problem List Patient Active Problem List   Diagnosis Date Noted  . RLS (restless legs syndrome) 08/27/2017  . Chronic toe pain, right third toe 08/27/2017  . 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 Macular degeneration of both eyes 08/27/2017  . Difficulty walking 08/27/2017  . Hearing loss, with bilateral hearing aids obtained from [facility] 08/19/2017  . Osteoporosis, on Prolia 08/16/2017  . Chronic pain of left knee  08/16/2017  . Chronic left-sided low back pain without sciatica 08/16/2017  . Macular degeneration 08/16/2017  .  Orthostatic intolerance 08/16/2017  . Aortic atherosclerosis ([place]) 08/16/2017  . Degenerative arthritis of lumbar spine 08/16/2017  . Allergic rhinitis, on Flonase prn 08/15/2017  . PAF (paroxysmal atrial fibrillation) ([place]) 05/05/2017  . Status post total right knee replacement 09/17/2015  . Atrial paroxysmal tachycardia ([place]) 12/06/2012  . Brown's syndrome, with double vision 10/07/2010  . Hiatal hernia, without GERD 10/07/2010  . Mitral valve prolapse 10/07/2010  . Seborrheic dermatitis, controlled with Aloe 10/07/2010  . Temporomandibular joint disorder, right side 10/07/2010  . Mild persistent asthma, on daily Pulmicort 07/17/2009  . Depressive disorder, on Lexapro 07/17/2009  . Hypercholesterolemia, no statin 07/17/2009  . Osteoarthritis 07/17/2009   Lyndee Hensen, PT, DPT 2:38 PM  11/16/17    [facility] [place], [place], [address] Phone: [phone]   Fax:  [phone]  Name: Mariah Bryant MRN: [id number] Date of Birth: Dec 07, 1930

## 2017-11-16 NOTE — Telephone Encounter (Signed)
Patient is requesting a prolia appointment for 11/22/17. Please contact patient to advise

## 2017-11-17 ENCOUNTER — Institutional Professional Consult (permissible substitution) (INDEPENDENT_AMBULATORY_CARE_PROVIDER_SITE_OTHER): Payer: Self-pay | Admitting: Physical Medicine and Rehabilitation

## 2017-11-17 NOTE — Telephone Encounter (Signed)
Called and scheduled patient for 11/22/17 as requested

## 2017-11-22 ENCOUNTER — Ambulatory Visit: Payer: Medicare Other

## 2017-11-22 ENCOUNTER — Encounter: Payer: Medicare Other | Admitting: Physical Therapy

## 2017-11-23 ENCOUNTER — Ambulatory Visit: Payer: Medicare Other | Admitting: Family Medicine

## 2017-11-24 DIAGNOSIS — H353221 Exudative age-related macular degeneration, left eye, with active choroidal neovascularization: Secondary | ICD-10-CM | POA: Diagnosis not present

## 2017-11-24 DIAGNOSIS — H353230 Exudative age-related macular degeneration, bilateral, stage unspecified: Secondary | ICD-10-CM | POA: Diagnosis not present

## 2017-12-01 ENCOUNTER — Ambulatory Visit: Payer: Medicare Other

## 2017-12-01 ENCOUNTER — Encounter: Payer: Medicare Other | Admitting: Physical Therapy

## 2017-12-05 ENCOUNTER — Encounter: Payer: Medicare Other | Admitting: Physical Therapy

## 2017-12-07 ENCOUNTER — Ambulatory Visit: Payer: Medicare Other | Admitting: Family Medicine

## 2017-12-07 ENCOUNTER — Encounter: Payer: Medicare Other | Admitting: Physical Therapy

## 2017-12-21 ENCOUNTER — Encounter: Payer: Self-pay | Admitting: Internal Medicine

## 2017-12-21 ENCOUNTER — Ambulatory Visit (INDEPENDENT_AMBULATORY_CARE_PROVIDER_SITE_OTHER): Payer: Medicare Other | Admitting: Internal Medicine

## 2017-12-21 VITALS — BP 129/75 | HR 68 | Ht 60.0 in | Wt 160.0 lb

## 2017-12-21 DIAGNOSIS — I471 Supraventricular tachycardia: Secondary | ICD-10-CM

## 2017-12-21 DIAGNOSIS — I951 Orthostatic hypotension: Secondary | ICD-10-CM | POA: Diagnosis not present

## 2017-12-21 DIAGNOSIS — I493 Ventricular premature depolarization: Secondary | ICD-10-CM | POA: Diagnosis not present

## 2017-12-21 NOTE — Patient Instructions (Signed)
Medication Instructions:  Your physician recommends that you continue on your current medications as directed. Please refer to the Current Medication list given to you today.  Labwork: You will have labs drawn today: CBC and BMP  Testing/Procedures: None ordered.  Follow-Up: Your physician recommends that you schedule a follow-up appointment in:   6 months with Dr Klein  Any Other Special Instructions Will Be Listed Below (If Applicable).     If you need a refill on your cardiac medications before your next appointment, please call your pharmacy.  

## 2017-12-21 NOTE — Progress Notes (Signed)
[name]           Patient Care Team: Briscoe Deutscher, DO as PCP - General (Family Medicine) Deboraha Sprang, MD as Consulting Physician (Cardiology)   HPI  Mariah Bryant is a 84 y.o. female seen in followup for atrial fibrillation;  she is treated with a combination of apixaban and metoprolol.  She was diagnosed in [place].  She was recently seen by an EP physician who recommended cryoablation for symptomatic palpitations which seemed primarily orthostatic.  She had other symptoms and objective evidence for orthostatic intolerance.  At the last visit we discussed the physiology of orthostatic intolerance and various nonpharmacological maneuvers.  She is much improved and is been very compliant with orthostatic maneuvers.  Worst time of the day is when she awakens in the morning and gets out of bed.  No clinical bleeding.  Fewer palpitations.   DATE TEST EF   3//14 Echo   60-65 %   6/17 Echo   70 % LA normal        Date Cr K Hgb  10/18 0.75 4.8 14.9          Also complaining of night sweats.  Has been on Lexapro for a couple of years.    Past Medical History:  Diagnosis Date  . Allergic rhinitis 08/15/2017  . Asthma, stable, moderate persistent 07/17/2009  . Atrial fibrillation ([place])   . 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 Chronic pain of left knee 08/16/2017  . Depressive disorder 07/17/2009  . Gastro-esophageal reflux disease without esophagitis 10/07/2010  . Hiatal hernia 10/07/2010  . Hypercholesterolemia 07/17/2009  . Hypogammaglobulinemia ([place]) 08/15/2017  . Macular degeneration 08/16/2017  . Orthostatic intolerance 08/16/2017  . Osteoarthritis 07/17/2009  . Osteoporosis 08/16/2017  . Seborrheic dermatitis 10/07/2010  . Sleep apnea 07/17/2009  . Temporomandibular joint disorder 10/07/2010    Past Surgical History:  Procedure Laterality Date  . APPENDECTOMY    . CATARACT EXTRACTION, BILATERAL    . CHOLECYSTECTOMY    . HYSTERECTOMY ABDOMINAL WITH  SALPINGO-OOPHORECTOMY    . REPLACEMENT TOTAL KNEE      Current Meds  Medication Sig  . albuterol (PROVENTIL HFA;VENTOLIN HFA) 108 (90 Base) MCG/ACT inhaler Inhale 2 puffs into the lungs every 4 (four) hours as needed for wheezing or shortness of breath.  [name] apixaban (ELIQUIS) 5 MG TABS tablet Take 5 mg by mouth 2 (two) times daily.  . budesonide (PULMICORT FLEXHALER) 180 MCG/ACT inhaler Take 2 puffs by mouth 2 (two) times daily as needed.  . cholecalciferol (VITAMIN D) 1000 units tablet Take 1,000 Units by mouth daily.  . cyanocobalamin 1000 MCG tablet Take 1,000 mcg by mouth daily.  [name] denosumab (PROLIA) 60 MG/ML SOSY injection Inject 60 mg into the skin every 6 (six) months.  . escitalopram (LEXAPRO) 20 MG tablet Take 20 mg by mouth daily.  . fluticasone (FLONASE) 50 MCG/ACT nasal spray Place 2 sprays into both nostrils 2 (two) times daily as needed for allergies or rhinitis.  . Multiple Vitamins-Minerals (PRESERVISION AREDS 2 PO) Take 1 tablet by mouth 2 (two) times daily.  .  Allergies  Allergen Reactions  . Amoxicillin Rash  . Epinephrine Palpitations  . Shellfish Allergy Anaphylaxis  . Morphine And Related Other (See Comments)    Irregular heart beat  . Atarax [Hydroxyzine] Anxiety  . Biaxin [Clarithromycin] Rash  . Codeine Palpitations  . Doxycycline Rash  . Novocain [Procaine] Palpitations  . Penicillins Rash      Review of Systems negative except  from HPI and PMH  Physical Exam BP 129/75   Pulse 68   Ht 5' (1.524 m)   Wt 160 lb (72.6 kg)   SpO2 97%   BMI 31.25 kg/m  Well developed and well nourished in no acute distress HENT normal E scleral and icterus clear Neck Supple JVP flat; carotids brisk and full Clear to ausculation Regular rate and rhythm, no murmurs gallops or rub Soft with active bowel sounds No clubbing cyanosis  Edema Alert and oriented, grossly normal motor and sensory function Skin Warm and Dry  ECG demonstrates sinus rhythm at 68 visit  interval 16/07/42  Assessment and Plan:   Atrial fibrillation-paroxysmal  Falls  Orthostatic hypotension  She is much better.  We will continue strategies for orthostatic intolerance.  We will check her blood work today on her apixaban but no clinical bleeding.  Night sweats.  Reviewed the literature.  May be related to her SSRI.  Have asked that she follow-up with her PCP about this.  The literature does not note whether there is an improvement with lower doses of SSRIs.   We will check a CRP to look for more systemic disease as a potential cause.   [name]

## 2017-12-22 ENCOUNTER — Encounter: Payer: Self-pay | Admitting: Family Medicine

## 2017-12-22 LAB — CBC
HEMATOCRIT: 44.4 % (ref 34.0–46.6)
Hemoglobin: 15.1 g/dL (ref 11.1–15.9)
MCH: 28.5 pg (ref 26.6–33.0)
MCHC: 34 g/dL (ref 31.5–35.7)
MCV: 84 fL (ref 79–97)
PLATELETS: 284 10*3/uL (ref 150–450)
RBC: 5.3 x10E6/uL — ABNORMAL HIGH (ref 3.77–5.28)
RDW: 14 % (ref 12.3–15.4)
WBC: 9.8 10*3/uL (ref 3.4–10.8)

## 2017-12-22 LAB — BASIC METABOLIC PANEL
BUN / CREAT RATIO: 22 (ref 12–28)
BUN: 17 mg/dL (ref 8–27)
CO2: 25 mmol/L (ref 20–29)
Calcium: 10.1 mg/dL (ref 8.7–10.3)
Chloride: 103 mmol/L (ref 96–106)
Creatinine, Ser: 0.79 mg/dL (ref 0.57–1.00)
GFR calc non Af Amer: 68 mL/min/{1.73_m2} (ref >59)
GFR, EST AFRICAN AMERICAN: 79 mL/min/{1.73_m2} (ref >59)
Glucose: 80 mg/dL (ref 65–99)
Potassium: 5 mmol/L (ref 3.5–5.2)
SODIUM: 144 mmol/L (ref 134–144)

## 2017-12-22 LAB — C-REACTIVE PROTEIN: CRP: 2 mg/L (ref 0–10)

## 2018-01-10 ENCOUNTER — Encounter: Payer: Self-pay | Admitting: Physical Therapy

## 2018-01-10 ENCOUNTER — Encounter: Payer: Self-pay | Admitting: Physician Assistant

## 2018-01-10 ENCOUNTER — Ambulatory Visit (INDEPENDENT_AMBULATORY_CARE_PROVIDER_SITE_OTHER): Payer: Medicare Other | Admitting: Physician Assistant

## 2018-01-10 ENCOUNTER — Ambulatory Visit (INDEPENDENT_AMBULATORY_CARE_PROVIDER_SITE_OTHER): Payer: Medicare Other | Admitting: Physical Therapy

## 2018-01-10 VITALS — BP 110/60 | HR 66 | Temp 97.7°F | Ht 61.0 in | Wt 157.2 lb

## 2018-01-10 DIAGNOSIS — Z23 Encounter for immunization: Secondary | ICD-10-CM

## 2018-01-10 DIAGNOSIS — R05 Cough: Secondary | ICD-10-CM | POA: Diagnosis not present

## 2018-01-10 DIAGNOSIS — Z77120 Contact with and (suspected) exposure to mold (toxic): Secondary | ICD-10-CM

## 2018-01-10 DIAGNOSIS — S0590XA Unspecified injury of unspecified eye and orbit, initial encounter: Secondary | ICD-10-CM

## 2018-01-10 DIAGNOSIS — R2689 Other abnormalities of gait and mobility: Secondary | ICD-10-CM

## 2018-01-10 DIAGNOSIS — M25552 Pain in left hip: Secondary | ICD-10-CM

## 2018-01-10 DIAGNOSIS — R059 Cough, unspecified: Secondary | ICD-10-CM

## 2018-01-10 NOTE — Therapy (Signed)
[facility] [address] [place], [place], [address] Phone: [phone]   Fax:  [phone]  Physical Therapy Treatment/ReCert   Patient Details  Name: Mariah Bryant MRN: [id number] Date of Birth: 10-28-1930 Referring Provider (PT): Dr. Juleen China   Encounter Date: 01/10/2018  PT End of Session - 01/10/18 1029    Visit Number  4    Number of Visits  12    Date for PT Re-Evaluation  02/21/18    PT Start Time  1020    PT Stop Time  1100    PT Time Calculation (min)  40 min    Activity Tolerance  Patient tolerated treatment well    Behavior During Therapy  [facility] for tasks assessed/performed       Past Medical History:  Diagnosis Date  . Allergic rhinitis 08/15/2017  . Asthma, stable, moderate persistent 07/17/2009  . Atrial fibrillation ([place])   . 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 Chronic pain of left knee 08/16/2017  . Depressive disorder 07/17/2009  . Gastro-esophageal reflux disease without esophagitis 10/07/2010  . Hiatal hernia 10/07/2010  . Hypercholesterolemia 07/17/2009  . Hypogammaglobulinemia ([place]) 08/15/2017  . Macular degeneration 08/16/2017  . Orthostatic intolerance 08/16/2017  . Osteoarthritis 07/17/2009  . Osteoporosis 08/16/2017  . Seborrheic dermatitis 10/07/2010  . Sleep apnea 07/17/2009  . Temporomandibular joint disorder 10/07/2010    Past Surgical History:  Procedure Laterality Date  . APPENDECTOMY    . CATARACT EXTRACTION, BILATERAL    . CHOLECYSTECTOMY    . HYSTERECTOMY ABDOMINAL WITH SALPINGO-OOPHORECTOMY    . REPLACEMENT TOTAL KNEE      There were no vitals filed for this visit.  Subjective Assessment - 01/10/18 1023    Subjective  Pt has been out of town, she has had eye surgery in [place], and has been back a few times since then.  She has to go back Dec 19th. Pt did have follow up with cardiologist, is doing better with heart racing, still feels tired, is doing a few exercises in AM that are helping.  Her L hip  continues to be painful, in groin area. She was doing exercises, but has slacked in the lsat 2 weeks. She is having increased pain with standing, walking.     Patient Stated Goals  "get rid of pain in L hip/buttock"     Currently in Pain?  Yes    Pain Score  4     Pain Location  Groin    Pain Orientation  Left    Pain Descriptors / Indicators  Aching    Pain Type  Chronic pain    Pain Onset  More than a month ago    Pain Frequency  Intermittent                       [facility] Adult PT Treatment/Exercise - 01/10/18 0001      Exercises   Exercises  Lumbar;Knee/Hip      Lumbar Exercises: Stretches   Single Knee to Chest Stretch  3 reps;30 seconds    Lower Trunk Rotation  --      Lumbar Exercises: Aerobic   Stationary Bike  L1 x 3 min      Knee/Hip Exercises: Stretches   Active Hamstring Stretch  --    Other Knee/Hip Stretches  HIp ER butterfly stretch 10 sec x3;       Knee/Hip Exercises: Supine   Other Supine Knee/Hip Exercises  Clam GTB x20;  Other Supine Knee/Hip Exercises  Hip ADd iso ball squeeze x20;       Manual Therapy   Manual Therapy  Joint mobilization;Passive ROM;Soft tissue mobilization    Joint Mobilization  L hip, long leg distraction 10 sec x5;  Posterior hip mobs ;     Passive ROM  L hip PROM for flexion , Abd, Prone quad stretch              PT Education - 01/10/18 1029    Education Details  HEP review       PT Short Term Goals - 01/10/18 1058      PT SHORT TERM GOAL #1   Title  patient to be independent with initial HEP    Time  2    Period  Weeks    Status  Partially Met    Target Date  01/24/18      PT SHORT TERM GOAL #2   Title  patient to improve L hip internal/external rotation by 5-10 degress for greater mobility     Time  2    Period  Weeks    Status  On-going    Target Date  01/24/18        PT Long Term Goals - 01/10/18 1056      PT LONG TERM GOAL #1   Title  patient to be independent with final HEP    Time   6    Period  Weeks    Status  On-going    Target Date  02/21/18      PT LONG TERM GOAL #2   Title  patient to improve L hip strength to >/= 4/5 in all planes    Time  6    Period  Weeks    Status  On-going    Target Date  02/21/18      PT LONG TERM GOAL #3   Title  patient to report ability to ambulate for up to 1 mi without increase in L hip pain/discomfort    Time  6    Period  Weeks    Status  Revised    Target Date  02/21/18      PT LONG TERM GOAL #4   Title  patient to report pain levels no greater than 2/10 wiht activity     Time  6    Period  Weeks    Status  Revised    Target Date  02/21/18            Plan - 01/10/18 1406    Clinical Impression Statement  Pt has been out of town, re-cert done today. Pt continues to have pain in L groing region, as well as significant stiffnesss and weakness of L hip. Pt with decreased ability for standing, walking, and IADLs due to pain. Pt to benefit from continuation of skilled care. Discussed consistency of appts with pt today, pt states understanding.     Rehab Potential  Good    PT Frequency  2x / week    PT Duration  6 weeks    PT Treatment/Interventions  ADLs/Self Care Home Management;Cryotherapy;Electrical Stimulation;Iontophoresis 48m/ml Dexamethasone;Moist Heat;Therapeutic exercise;Therapeutic activities;Functional mobility training;Stair training;Gait training;Ultrasound;Balance training;Neuromuscular re-education;Patient/family education;Manual techniques;Vasopneumatic Device;Taping;Dry needling;Passive range of motion    Consulted and Agree with Plan of Care  Patient       Patient will benefit from skilled therapeutic intervention in order to improve the following deficits and impairments:  Abnormal gait, Decreased activity tolerance, Decreased balance, Decreased  range of motion, Decreased mobility, Difficulty walking, Decreased strength, Pain  Visit Diagnosis: Pain in left hip  Other abnormalities of gait and  mobility     Problem List Patient Active Problem List   Diagnosis Date Noted  . RLS (restless legs syndrome) 08/27/2017  . Chronic toe pain, right third toe 08/27/2017  . 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 Macular degeneration of both eyes 08/27/2017  . Difficulty walking 08/27/2017  . Hearing loss, with bilateral hearing aids obtained from [facility] 08/19/2017  . Osteoporosis, on Prolia 08/16/2017  . Chronic pain of left knee 08/16/2017  . Chronic left-sided low back pain without sciatica 08/16/2017  . Macular degeneration 08/16/2017  . Orthostatic intolerance 08/16/2017  . Aortic atherosclerosis ([place]) 08/16/2017  . Degenerative arthritis of lumbar spine 08/16/2017  . Allergic rhinitis, on Flonase prn 08/15/2017  . PAF (paroxysmal atrial fibrillation) ([place]) 05/05/2017  . Status post total right knee replacement 09/17/2015  . Atrial paroxysmal tachycardia ([place]) 12/06/2012  . Brown's syndrome, with double vision 10/07/2010  . Hiatal hernia, without GERD 10/07/2010  . Mitral valve prolapse 10/07/2010  . Seborrheic dermatitis, controlled with Aloe 10/07/2010  . Temporomandibular joint disorder, right side 10/07/2010  . Mild persistent asthma, on daily Pulmicort 07/17/2009  . Depressive disorder, on Lexapro 07/17/2009  . Hypercholesterolemia, no statin 07/17/2009  . Osteoarthritis 07/17/2009    Lyndee Hensen, PT, DPT 2:11 PM  01/10/18    [facility] [place], [place], [address] Phone: [phone]   Fax:  [phone]  Name: Jalyne Brodzinski MRN: [id number] Date of Birth: 06-Oct-1930

## 2018-01-10 NOTE — Patient Instructions (Signed)
It was great to see you!  Start taking pulmicort once in AM and once in PM. Try this for a week. If symptoms are improved with this, please continue.  Take care,  Inda Coke PA-C

## 2018-01-10 NOTE — Progress Notes (Signed)
Mariah Bryant is a 84 y.o. female here for a new problem.  I acted as a Education administrator for [employer], PA-C Anselmo Pickler, LPN  History of Present Illness:   Chief Complaint  Patient presents with  . Cough    x 2 weeks,runny nose,fatique    HPI   Patient is here because she is concerned about her URI symptoms and fatigue. She is with her daughter who I just saw for similar issues. Patient lives with daughter and they recently discovered that there is mold in their home. Everyone in the home is having similar flu-like symptoms. She states that she recently traveled home to [place] and when she returned she felt like she was developing symptoms, not sure if its because of her home or not. Doesn't use her inhalers on a regular basis. Was told to take her Pulmicort regularly for a bit and then just "as needed." Rarely takes Albuterol. Denies: fevers, SOB, chest pain, joint pain.  She is also wanting me to evaluate her L eye. She states that she has a puppy and her puppy jumped on her L eye. She has a history of macular degeneration (currently being treated when she visits [place]). She states that her vision was a little more blurry than usual after the dog jumped on it but it is better now. Denies double vision, pain with EOM, bleeding.   Past Medical History:  Diagnosis Date  . Allergic rhinitis 08/15/2017  . Asthma, stable, moderate persistent 07/17/2009  . Atrial fibrillation ([place])   . 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 Chronic pain of left knee 08/16/2017  . Depressive disorder 07/17/2009  . Gastro-esophageal reflux disease without esophagitis 10/07/2010  . Hiatal hernia 10/07/2010  . Hypercholesterolemia 07/17/2009  . Hypogammaglobulinemia ([place]) 08/15/2017  . Macular degeneration 08/16/2017  . Orthostatic intolerance 08/16/2017  . Osteoarthritis 07/17/2009  . Osteoporosis 08/16/2017  . Seborrheic dermatitis 10/07/2010  . Sleep apnea 07/17/2009  . Temporomandibular joint disorder 10/07/2010      Social History   Socioeconomic History  . Marital status: Widowed    Spouse name: Not on file  . Number of children: Not on file  . Years of education: Not on file  . Highest education level: Not on file  Occupational History  . Not on file  Social Needs  . Financial resource strain: Not on file  . Food insecurity:    Worry: Not on file    Inability: Not on file  . Transportation needs:    Medical: Not on file    Non-medical: Not on file  Tobacco Use  . Smoking status: Never Smoker  . Smokeless tobacco: Never Used  Substance and Sexual Activity  . Alcohol use: Yes    Alcohol/week: 1.0 standard drinks    Types: 1 Glasses of wine per week    Comment: occasionally  . Drug use: Never  . Sexual activity: Not on file  Lifestyle  . Physical activity:    Days per week: Not on file    Minutes per session: Not on file  . Stress: Not on file  Relationships  . Social connections:    Talks on phone: Not on file    Gets together: Not on file    Attends religious service: Not on file    Active member of club or organization: Not on file    Attends meetings of clubs or organizations: Not on file    Relationship status: Not on file  . Intimate partner violence:  Fear of current or ex partner: Not on file    Emotionally abused: Not on file    Physically abused: Not on file    Forced sexual activity: Not on file  Other Topics Concern  . Not on file  Social History Narrative   Lives with daughter and family. Has upstairs suite at the moment. They are working on getting her to the first floor.     Past Surgical History:  Procedure Laterality Date  . APPENDECTOMY    . CATARACT EXTRACTION, BILATERAL    . CHOLECYSTECTOMY    . HYSTERECTOMY ABDOMINAL WITH SALPINGO-OOPHORECTOMY    . REPLACEMENT TOTAL KNEE      Family History  Problem Relation Age of Onset  . Stroke Mother   . Cancer Sister   . Cancer Brother     Allergies  Allergen Reactions  . Amoxicillin Rash  .  Epinephrine Palpitations  . Shellfish Allergy Anaphylaxis  . Morphine And Related Other (See Comments)    Irregular heart beat  . Atarax [Hydroxyzine] Anxiety  . Biaxin [Clarithromycin] Rash  . Codeine Palpitations  . Doxycycline Rash  . Novocain [Procaine] Palpitations  . Penicillins Rash    Current Medications:   Current Outpatient Medications:  .  albuterol (PROVENTIL HFA;VENTOLIN HFA) 108 (90 Base) MCG/ACT inhaler, Inhale 2 puffs into the lungs every 4 (four) hours as needed for wheezing or shortness of breath., Disp: , Rfl:  .  apixaban (ELIQUIS) 5 MG TABS tablet, Take 5 mg by mouth 2 (two) times daily., Disp: , Rfl:  .  budesonide (PULMICORT FLEXHALER) 180 MCG/ACT inhaler, Take 2 puffs by mouth 2 (two) times daily as needed., Disp: , Rfl:  .  cholecalciferol (VITAMIN D) 1000 units tablet, Take 1,000 Units by mouth daily., Disp: , Rfl:  .  cyanocobalamin 1000 MCG tablet, Take 1,000 mcg by mouth daily., Disp: , Rfl:  .  denosumab (PROLIA) 60 MG/ML SOSY injection, Inject 60 mg into the skin every 6 (six) months., Disp: , Rfl:  .  escitalopram (LEXAPRO) 20 MG tablet, Take 20 mg by mouth daily., Disp: , Rfl:  .  fluticasone (FLONASE) 50 MCG/ACT nasal spray, Place 2 sprays into both nostrils 2 (two) times daily as needed for allergies or rhinitis., Disp: , Rfl:  .  Multiple Vitamins-Minerals (PRESERVISION AREDS 2 PO), Take 1 tablet by mouth 2 (two) times daily., Disp: , Rfl:    Review of Systems:   ROS Negative unless otherwise specified per HPI.  Vitals:   Vitals:   01/10/18 1111  BP: 110/60  Pulse: 66  Temp: 97.7 F (36.5 C)  TempSrc: Oral  SpO2: 95%  Weight: 157 lb 3.2 oz (71.3 kg)  Height: 5\' 1"  (1.549 m)     Body mass index is 29.7 kg/m.  Physical Exam:   Physical Exam  Constitutional: She appears well-developed. She is cooperative.  Non-toxic appearance. She does not have a sickly appearance. She does not appear ill. No distress.  HENT:  Head: Normocephalic  and atraumatic.  Right Ear: Tympanic membrane, external ear and ear canal normal. Tympanic membrane is not erythematous, not retracted and not bulging.  Left Ear: Tympanic membrane, external ear and ear canal normal. Tympanic membrane is not erythematous, not retracted and not bulging.  Nose: Nose normal. Right sinus exhibits no maxillary sinus tenderness and no frontal sinus tenderness. Left sinus exhibits no maxillary sinus tenderness and no frontal sinus tenderness.  Mouth/Throat: Uvula is midline and mucous membranes are normal. No posterior oropharyngeal edema  or posterior oropharyngeal erythema. Tonsils are 0 on the right. Tonsils are 0 on the left.  Eyes: Pupils are equal, round, and reactive to light. Conjunctivae, EOM and lids are normal. Right eye exhibits normal extraocular motion.  Neck: Trachea normal.  Cardiovascular: Normal rate, regular rhythm, S1 normal, S2 normal and normal heart sounds.  Pulmonary/Chest: Effort normal and breath sounds normal. She has no decreased breath sounds. She has no wheezes. She has no rhonchi. She has no rales.  Lymphadenopathy:    She has no cervical adenopathy.  Neurological: She is alert.  Skin: Skin is warm, dry and intact.  L orbital area without palpable tenderness, or obvious erythema/ecchymosis   Psychiatric: She has a normal mood and affect. Her speech is normal and behavior is normal.  Nursing note and vitals reviewed.   Assessment and Plan:    Melissia was seen today for cough.  Diagnoses and all orders for this visit:  Cough; Suspect exposure to mold No red flags on exam.  Will initiate scheduled Pulmicort per orders. Albuterol prn. Discussed taking medications as prescribed. Reviewed return precautions including worsening fever, SOB, worsening cough or other concerns. Push fluids and rest. I recommend that patient follow-up if symptoms worsen or persist despite treatment x 7-10 days, sooner if needed. She is open to possible referral for  her mold exposure, would like to defer to her daughter about this decision. We called [facility] and they perform this testing. She will let us know if she would like this referral. Additionally, she states that she would maybe be better served at a teaching hospital. Our referral coordinator reached out to [facility] and they stated that they would possibly review her information if we faxed it and they would determine if she was eligible to be seen in their office. She states that she will let us know if she would like to pursue this option. Briefly discussed getting a chest xray but she declined.   Need for prophylactic vaccination and inoculation against influenza -     Flu vaccine HIGH DOSE PF (Fluzone High dose)  Eye trauma No red flags on my exam. Discussed establishing with an eye doctor locally but she declined at this time. Symptoms are overall improved. She is aware of red flags and concerns with her macular degeneration history and when to seek medical attention.  . Reviewed expectations re: course of current medical issues. . Discussed self-management of symptoms. . Outlined signs and symptoms indicating need for more acute intervention. . Patient verbalized understanding and all questions were answered. . See orders for this visit as documented in the electronic medical record. . Patient received an After-Visit Summary.  CMA or LPN served as scribe during this visit. History, Physical, and Plan performed by medical provider. The above documentation has been reviewed and is accurate and complete.  Inda Coke, PA-C

## 2018-01-12 ENCOUNTER — Ambulatory Visit (INDEPENDENT_AMBULATORY_CARE_PROVIDER_SITE_OTHER): Payer: Medicare Other | Admitting: Physical Therapy

## 2018-01-12 ENCOUNTER — Encounter: Payer: Self-pay | Admitting: Physical Therapy

## 2018-01-12 DIAGNOSIS — R2689 Other abnormalities of gait and mobility: Secondary | ICD-10-CM

## 2018-01-12 DIAGNOSIS — M25552 Pain in left hip: Secondary | ICD-10-CM | POA: Diagnosis not present

## 2018-01-12 NOTE — Patient Instructions (Signed)
Access Code: [email or web address]  URL: [email or web address]  Date: 01/12/2018  Prepared by: Lyndee Hensen   Exercises  Supine Single Knee to Chest Stretch - 3 reps - 30 hold - 2x daily  Supine Lower Trunk Rotation - 1 sets - 10 hold - 2x daily  Bent Knee Fallouts - 5 reps - 10 hold - 2x daily  Hooklying Isometric Clamshell - 10 reps - 2 sets - 2x daily  Seated Hamstring Stretch - 3 reps - 30 hold - 2x daily  Standing Hip Abduction - 10 reps - 1 sets - 2x daily  Standing Marching - 10 reps - 1-2 sets - 2x daily

## 2018-01-15 ENCOUNTER — Encounter: Payer: Self-pay | Admitting: Physical Therapy

## 2018-01-15 NOTE — Therapy (Addendum)
[facility] [address] [place], [place], [address] Phone: [phone]   Fax:  [phone]  Physical Therapy Treatment  Patient Details  Name: Mariah Bryant MRN: [id number] Date of Birth: 09-29-1930 Referring Provider (PT): Dr. Juleen China   Encounter Date: 01/12/2018  PT End of Session - 01/15/18 1257    Visit Number  5    Number of Visits  12    Date for PT Re-Evaluation  02/21/18    PT Start Time  1350    PT Stop Time  1430    PT Time Calculation (min)  40 min    Activity Tolerance  Patient tolerated treatment well    Behavior During Therapy  [facility] for tasks assessed/performed       Past Medical History:  Diagnosis Date  . Allergic rhinitis 08/15/2017  . Asthma, stable, moderate persistent 07/17/2009  . Atrial fibrillation ([place])   . 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 Chronic pain of left knee 08/16/2017  . Depressive disorder 07/17/2009  . Gastro-esophageal reflux disease without esophagitis 10/07/2010  . Hiatal hernia 10/07/2010  . Hypercholesterolemia 07/17/2009  . Hypogammaglobulinemia ([employer]) 08/15/2017  . Macular degeneration 08/16/2017  . Orthostatic intolerance 08/16/2017  . Osteoarthritis 07/17/2009  . Osteoporosis 08/16/2017  . Seborrheic dermatitis 10/07/2010  . Sleep apnea 07/17/2009  . Temporomandibular joint disorder 10/07/2010    Past Surgical History:  Procedure Laterality Date  . APPENDECTOMY    . CATARACT EXTRACTION, BILATERAL    . CHOLECYSTECTOMY    . HYSTERECTOMY ABDOMINAL WITH SALPINGO-OOPHORECTOMY    . REPLACEMENT TOTAL KNEE      There were no vitals filed for this visit.  Subjective Assessment - 01/15/18 1257    Subjective  no new complaints. has been doing HEP.     Currently in Pain?  Yes    Pain Score  5     Pain Location  Groin    Pain Orientation  Left    Pain Descriptors / Indicators  Aching    Pain Type  Chronic pain    Pain Onset  More than a month ago    Pain Frequency  Intermittent                        [facility] Adult PT Treatment/Exercise - 01/15/18 0001      Exercises   Exercises  Lumbar;Knee/Hip      Lumbar Exercises: Stretches   Active Hamstring Stretch  3 reps;30 seconds    Single Knee to Chest Stretch  3 reps;30 seconds      Lumbar Exercises: Aerobic   Stationary Bike  L1 x 5 min      Knee/Hip Exercises: Standing   Hip Flexion  20 reps;Knee bent    Hip Abduction  10 reps;2 sets;Both    Hip Extension  2 sets;10 reps    Forward Step Up  1 set;10 reps;Both      Knee/Hip Exercises: Supine   Other Supine Knee/Hip Exercises  Clam GTB x20;     Other Supine Knee/Hip Exercises  Hip ADd iso ball squeeze x20;       Manual Therapy   Manual Therapy  Joint mobilization;Passive ROM;Soft tissue mobilization    Joint Mobilization  L hip, long leg distraction 10 sec x5;  inferior and lateral hip mobs with strap ;     Passive ROM  L hip PROM for flexion , Abd, Prone quad stretch  PT Short Term Goals - 01/10/18 1058      PT SHORT TERM GOAL #1   Title  patient to be independent with initial HEP    Time  2    Period  Weeks    Status  Partially Met    Target Date  01/24/18      PT SHORT TERM GOAL #2   Title  patient to improve L hip internal/external rotation by 5-10 degress for greater mobility     Time  2    Period  Weeks    Status  On-going    Target Date  01/24/18        PT Long Term Goals - 01/10/18 1056      PT LONG TERM GOAL #1   Title  patient to be independent with final HEP    Time  6    Period  Weeks    Status  On-going    Target Date  02/21/18      PT LONG TERM GOAL #2   Title  patient to improve L hip strength to >/= 4/5 in all planes    Time  6    Period  Weeks    Status  On-going    Target Date  02/21/18      PT LONG TERM GOAL #3   Title  patient to report ability to ambulate for up to 1 mi without increase in L hip pain/discomfort    Time  6    Period  Weeks    Status  Revised    Target Date   02/21/18      PT LONG TERM GOAL #4   Title  patient to report pain levels no greater than 2/10 wiht activity     Time  6    Period  Weeks    Status  Revised    Target Date  02/21/18            Plan - 01/15/18 1258    Clinical Impression Statement  Pt with stiffness in L hip, mobilization and PROM done today to improve. Ther ex done for ROM and strengthening. Pt able to get full revolution on bike today, despite stiffness in R knee. Plan to progress strengthening as tolerated.     Rehab Potential  Good    PT Frequency  2x / week    PT Duration  6 weeks    PT Treatment/Interventions  ADLs/Self Care Home Management;Cryotherapy;Electrical Stimulation;Iontophoresis 11m/ml Dexamethasone;Moist Heat;Therapeutic exercise;Therapeutic activities;Functional mobility training;Stair training;Gait training;Ultrasound;Balance training;Neuromuscular re-education;Patient/family education;Manual techniques;Vasopneumatic Device;Taping;Dry needling;Passive range of motion    Consulted and Agree with Plan of Care  Patient       Patient will benefit from skilled therapeutic intervention in order to improve the following deficits and impairments:  Abnormal gait, Decreased activity tolerance, Decreased balance, Decreased range of motion, Decreased mobility, Difficulty walking, Decreased strength, Pain  Visit Diagnosis: Pain in left hip  Other abnormalities of gait and mobility     Problem List Patient Active Problem List   Diagnosis Date Noted  . RLS (restless legs syndrome) 08/27/2017  . Chronic toe pain, right third toe 08/27/2017  . 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 Macular degeneration of both eyes 08/27/2017  . Difficulty walking 08/27/2017  . [facility] loss, [facility]  . Osteoporosis, on Prolia 08/16/2017  . Chronic pain of left knee 08/16/2017  . Chronic left-sided low back  pain  without sciatica 08/16/2017  . Macular degeneration 08/16/2017  . Orthostatic intolerance 08/16/2017  . Aortic atherosclerosis ([place]) 08/16/2017  . Degenerative arthritis of lumbar spine 08/16/2017  . Allergic rhinitis, on Flonase prn 08/15/2017  . PAF (paroxysmal atrial fibrillation) ([place]) 05/05/2017  . Status post total right knee replacement 09/17/2015  . Atrial paroxysmal tachycardia ([place]) 12/06/2012  . Brown's syndrome, with double vision 10/07/2010  . Hiatal hernia, without GERD 10/07/2010  . Mitral valve prolapse 10/07/2010  . Seborrheic dermatitis, controlled with Aloe 10/07/2010  . Temporomandibular joint disorder, right side 10/07/2010  . Mild persistent asthma, on daily Pulmicort 07/17/2009  . Depressive disorder, on Lexapro 07/17/2009  . Hypercholesterolemia, no statin 07/17/2009  . Osteoarthritis 07/17/2009    Lyndee Hensen, PT, DPT 1:02 PM  01/15/18    [facility] [place], [place], [address] Phone: [phone]   Fax:  [phone]  Name: Mariah Bryant MRN: [id number] Date of Birth: [birth year]

## 2018-01-16 ENCOUNTER — Encounter: Payer: Self-pay | Admitting: Physical Therapy

## 2018-01-16 ENCOUNTER — Ambulatory Visit (INDEPENDENT_AMBULATORY_CARE_PROVIDER_SITE_OTHER): Payer: Medicare Other | Admitting: Physical Therapy

## 2018-01-16 DIAGNOSIS — R2689 Other abnormalities of gait and mobility: Secondary | ICD-10-CM

## 2018-01-16 DIAGNOSIS — M25552 Pain in left hip: Secondary | ICD-10-CM | POA: Diagnosis not present

## 2018-01-16 NOTE — Therapy (Addendum)
[facility] [address] [place], [place], [address] Phone: [phone]   Fax:  [phone]  Physical Therapy Treatment  Patient Details  Name: Mariah Bryant MRN: [id number] Date of Birth: [birth year] Referring Provider (PT): Dr. Juleen China   Encounter Date: 01/16/2018  PT End of Session - 01/16/18 1224    Visit Number  6    Number of Visits  12    Date for PT Re-Evaluation  02/21/18    PT Start Time  1218    PT Stop Time  1259    PT Time Calculation (min)  41 min    Activity Tolerance  Patient tolerated treatment well    Behavior During Therapy  [facility] for tasks assessed/performed       Past Medical History:  Diagnosis Date  . Allergic rhinitis 08/15/2017  . Asthma, stable, moderate persistent 07/17/2009  . Atrial fibrillation ([place])   . 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 Chronic pain of left knee 08/16/2017  . Depressive disorder 07/17/2009  . Gastro-esophageal reflux disease without esophagitis 10/07/2010  . Hiatal hernia 10/07/2010  . Hypercholesterolemia 07/17/2009  . Hypogammaglobulinemia ([place]) 08/15/2017  . Macular degeneration 08/16/2017  . Orthostatic intolerance 08/16/2017  . Osteoarthritis 07/17/2009  . Osteoporosis 08/16/2017  . Seborrheic dermatitis 10/07/2010  . Sleep apnea 07/17/2009  . Temporomandibular joint disorder 10/07/2010    Past Surgical History:  Procedure Laterality Date  . APPENDECTOMY    . CATARACT EXTRACTION, BILATERAL    . CHOLECYSTECTOMY    . HYSTERECTOMY ABDOMINAL WITH SALPINGO-OOPHORECTOMY    . REPLACEMENT TOTAL KNEE      There were no vitals filed for this visit.  Subjective Assessment - 01/16/18 1222    Subjective  Pt states continued stiffness feeling in hip, feels pain is a bit less, has been doing HEP.     Currently in Pain?  Yes    Pain Score  3     Pain Location  Groin    Pain Orientation  Left    Pain Descriptors / Indicators  Aching    Pain Type  Chronic pain    Pain Onset  More than a month  ago    Pain Frequency  Intermittent                       [facility] Adult PT Treatment/Exercise - 01/16/18 1230      Exercises   Exercises  Lumbar;Knee/Hip      Lumbar Exercises: Stretches   Active Hamstring Stretch  --    Single Knee to Chest Stretch  3 reps;30 seconds    Lower Trunk Rotation  10 seconds;5 reps    Other Lumbar Stretch Exercise  Seated hip hinge x10 '      Lumbar Exercises: Aerobic   Stationary Bike  L1 x 5 min      Lumbar Exercises: Supine   Bent Knee Raise  15 reps    Bridge  5 reps      Knee/Hip Exercises: Standing   Hip Flexion  20 reps;Knee bent    Hip Abduction  10 reps;2 sets;Both    Hip Extension  2 sets;10 reps;Both    Forward Step Up  1 set;10 reps;Both    Stairs  up/down 5 steps with 1 hand rail x5;       Knee/Hip Exercises: Seated   Sit to Sand  10 reps      Knee/Hip Exercises: Supine   Other Supine Knee/Hip Exercises  --  Other Supine Knee/Hip Exercises  --      Manual Therapy   Manual Therapy  Joint mobilization;Passive ROM;Soft tissue mobilization    Joint Mobilization  L hip, long leg distraction 10 sec x5;  inferior and lateral hip mobs with strap ;     Passive ROM  L hip PROM for flexion , Abd, s/l R quad stretch;                PT Short Term Goals - 01/10/18 1058      PT SHORT TERM GOAL #1   Title  patient to be independent with initial HEP    Time  2    Period  Weeks    Status  Partially Met    Target Date  01/24/18      PT SHORT TERM GOAL #2   Title  patient to improve L hip internal/external rotation by 5-10 degress for greater mobility     Time  2    Period  Weeks    Status  On-going    Target Date  01/24/18        PT Long Term Goals - 01/10/18 1056      PT LONG TERM GOAL #1   Title  patient to be independent with final HEP    Time  6    Period  Weeks    Status  On-going    Target Date  02/21/18      PT LONG TERM GOAL #2   Title  patient to improve L hip strength to >/= 4/5 in all  planes    Time  6    Period  Weeks    Status  On-going    Target Date  02/21/18      PT LONG TERM GOAL #3   Title  patient to report ability to ambulate for up to 1 mi without increase in L hip pain/discomfort    Time  6    Period  Weeks    Status  Revised    Target Date  02/21/18      PT LONG TERM GOAL #4   Title  patient to report pain levels no greater than 2/10 wiht activity     Time  6    Period  Weeks    Status  Revised    Target Date  02/21/18            Plan - 01/16/18 1256    Clinical Impression Statement  Pt with improving ability for ther ex and strengthening. Limited range of motion with hip ther ex, due to stiffness. pt with improved mechanics on stairs, with decreased circumduction and decreased need for UE support. Plan to continue strength progression and ROM for L hip.     Rehab Potential  Good    PT Frequency  2x / week    PT Duration  6 weeks    PT Treatment/Interventions  ADLs/Self Care Home Management;Cryotherapy;Electrical Stimulation;Iontophoresis 36m/ml Dexamethasone;Moist Heat;Therapeutic exercise;Therapeutic activities;Functional mobility training;Stair training;Gait training;Ultrasound;Balance training;Neuromuscular re-education;Patient/family education;Manual techniques;Vasopneumatic Device;Taping;Dry needling;Passive range of motion    Consulted and Agree with Plan of Care  Patient       Patient will benefit from skilled therapeutic intervention in order to improve the following deficits and impairments:  Abnormal gait, Decreased activity tolerance, Decreased balance, Decreased range of motion, Decreased mobility, Difficulty walking, Decreased strength, Pain  Visit Diagnosis: Pain in left hip  Other abnormalities of gait and mobility     Problem List Patient  Active Problem List   Diagnosis Date Noted  . RLS (restless legs syndrome) 08/27/2017  . Chronic toe pain, right third toe 08/27/2017  . 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 Macular degeneration of both eyes 08/27/2017  . Difficulty walking 08/27/2017  . Hearing loss, with bilateral [facility] aids obtained from [facility] 08/19/2017  . Osteoporosis, on Prolia 08/16/2017  . Chronic pain of left knee 08/16/2017  . Chronic left-sided low back pain without sciatica 08/16/2017  . Macular degeneration 08/16/2017  . Orthostatic intolerance 08/16/2017  . Aortic atherosclerosis ([place]) 08/16/2017  . Degenerative arthritis of lumbar spine 08/16/2017  . Allergic rhinitis, on Flonase prn 08/15/2017  . PAF (paroxysmal atrial fibrillation) ([place]) 05/05/2017  . Status post total right knee replacement 09/17/2015  . Atrial paroxysmal tachycardia ([place]) 12/06/2012  . Brown's syndrome, with double vision 10/07/2010  . Hiatal hernia, without GERD 10/07/2010  . Mitral valve prolapse 10/07/2010  . Seborrheic dermatitis, controlled with Aloe 10/07/2010  . Temporomandibular joint disorder, right side 10/07/2010  . Mild persistent asthma, on daily Pulmicort 07/17/2009  . Depressive disorder, on Lexapro 07/17/2009  . Hypercholesterolemia, no statin 07/17/2009  . Osteoarthritis 07/17/2009   Lyndee Hensen, PT, DPT 12:59 PM  01/16/18    [facility] [place], [place], [address] Phone: [phone]   Fax:  [phone]  Name: Mariah Bryant MRN: [id number]  Date of Birth: 10/12/1930    PHYSICAL THERAPY DISCHARGE SUMMARY  Visits from Start of Care: 6  Plan:                                                    Patient goals were partially met. Patient is being discharged due to not returning since the last visit.  ?????     Lyndee Hensen, PT, DPT 12:55 PM  04/27/18

## 2018-01-18 ENCOUNTER — Encounter: Payer: Medicare Other | Admitting: Physical Therapy

## 2018-01-23 ENCOUNTER — Encounter: Payer: Medicare Other | Admitting: Physical Therapy

## 2018-01-25 ENCOUNTER — Encounter: Payer: Medicare Other | Admitting: Physical Therapy

## 2018-01-25 ENCOUNTER — Ambulatory Visit: Payer: Medicare Other

## 2018-01-30 ENCOUNTER — Encounter: Payer: Medicare Other | Admitting: Physical Therapy

## 2018-01-30 DIAGNOSIS — Z0289 Encounter for other administrative examinations: Secondary | ICD-10-CM

## 2018-02-01 ENCOUNTER — Encounter: Payer: Medicare Other | Admitting: Physical Therapy

## 2018-02-06 ENCOUNTER — Encounter: Payer: Medicare Other | Admitting: Physical Therapy

## 2018-02-08 ENCOUNTER — Encounter: Payer: Medicare Other | Admitting: Physical Therapy

## 2018-02-09 ENCOUNTER — Other Ambulatory Visit: Payer: Self-pay

## 2018-02-09 MED ORDER — APIXABAN 5 MG PO TABS: 5.0000 mg | ORAL_TABLET | Freq: Two times a day (BID) | ORAL | 10 refills | Status: DC

## 2018-02-09 NOTE — Telephone Encounter (Signed)
Pt is a 84 yr old female who last saw Dr Caryl Comes on 12/21/17. Weight at that visit was 72.6Kg. Last SCr was 0.79 on 12/21/17. Will refill Eliquis 5mg  BID.

## 2018-02-13 ENCOUNTER — Encounter: Payer: Medicare Other | Admitting: Physical Therapy

## 2018-02-16 DIAGNOSIS — H353220 Exudative age-related macular degeneration, left eye, stage unspecified: Secondary | ICD-10-CM | POA: Diagnosis not present

## 2018-02-20 ENCOUNTER — Encounter: Payer: Medicare Other | Admitting: Physical Therapy

## 2018-03-17 DIAGNOSIS — C44629 Squamous cell carcinoma of skin of left upper limb, including shoulder: Secondary | ICD-10-CM | POA: Diagnosis not present

## 2018-03-17 DIAGNOSIS — D485 Neoplasm of uncertain behavior of skin: Secondary | ICD-10-CM | POA: Diagnosis not present

## 2018-04-05 DIAGNOSIS — L905 Scar conditions and fibrosis of skin: Secondary | ICD-10-CM | POA: Diagnosis not present

## 2018-04-05 DIAGNOSIS — C44629 Squamous cell carcinoma of skin of left upper limb, including shoulder: Secondary | ICD-10-CM | POA: Diagnosis not present

## 2018-04-12 ENCOUNTER — Telehealth: Payer: Self-pay

## 2018-04-12 NOTE — Telephone Encounter (Signed)
Called daughter [name] and left a voicemail message that patient needs to be scheduled for her Prolia injection

## 2018-04-19 DIAGNOSIS — L821 Other seborrheic keratosis: Secondary | ICD-10-CM | POA: Diagnosis not present

## 2018-04-19 DIAGNOSIS — Z85828 Personal history of other malignant neoplasm of skin: Secondary | ICD-10-CM | POA: Diagnosis not present

## 2018-04-19 DIAGNOSIS — L905 Scar conditions and fibrosis of skin: Secondary | ICD-10-CM | POA: Diagnosis not present

## 2018-04-19 DIAGNOSIS — D2371 Other benign neoplasm of skin of right lower limb, including hip: Secondary | ICD-10-CM | POA: Diagnosis not present

## 2018-04-19 DIAGNOSIS — L812 Freckles: Secondary | ICD-10-CM | POA: Diagnosis not present

## 2018-04-19 DIAGNOSIS — Z4802 Encounter for removal of sutures: Secondary | ICD-10-CM | POA: Diagnosis not present

## 2018-09-14 DIAGNOSIS — Z9841 Cataract extraction status, right eye: Secondary | ICD-10-CM | POA: Diagnosis not present

## 2018-09-14 DIAGNOSIS — H353220 Exudative age-related macular degeneration, left eye, stage unspecified: Secondary | ICD-10-CM | POA: Diagnosis not present

## 2018-09-14 DIAGNOSIS — H539 Unspecified visual disturbance: Secondary | ICD-10-CM | POA: Diagnosis not present

## 2018-09-14 DIAGNOSIS — H52203 Unspecified astigmatism, bilateral: Secondary | ICD-10-CM | POA: Diagnosis not present

## 2018-09-14 DIAGNOSIS — Z9842 Cataract extraction status, left eye: Secondary | ICD-10-CM | POA: Diagnosis not present

## 2018-09-14 DIAGNOSIS — H524 Presbyopia: Secondary | ICD-10-CM | POA: Diagnosis not present

## 2018-09-14 DIAGNOSIS — H50611 Brown's sheath syndrome, right eye: Secondary | ICD-10-CM | POA: Diagnosis not present

## 2018-09-14 DIAGNOSIS — H5231 Anisometropia: Secondary | ICD-10-CM | POA: Diagnosis not present

## 2018-09-27 DIAGNOSIS — H353221 Exudative age-related macular degeneration, left eye, with active choroidal neovascularization: Secondary | ICD-10-CM | POA: Diagnosis not present

## 2018-11-14 ENCOUNTER — Ambulatory Visit (INDEPENDENT_AMBULATORY_CARE_PROVIDER_SITE_OTHER): Payer: Medicare Other | Admitting: Family Medicine

## 2018-11-14 ENCOUNTER — Other Ambulatory Visit: Payer: Self-pay

## 2018-11-14 ENCOUNTER — Encounter: Payer: Self-pay | Admitting: Family Medicine

## 2018-11-14 VITALS — BP 130/78 | HR 68 | Temp 98.0°F | Ht 61.0 in | Wt 155.4 lb

## 2018-11-14 DIAGNOSIS — M1710 Unilateral primary osteoarthritis, unspecified knee: Secondary | ICD-10-CM

## 2018-11-14 DIAGNOSIS — Z23 Encounter for immunization: Secondary | ICD-10-CM

## 2018-11-14 DIAGNOSIS — I480 Paroxysmal atrial fibrillation: Secondary | ICD-10-CM

## 2018-11-14 DIAGNOSIS — E7800 Pure hypercholesterolemia, unspecified: Secondary | ICD-10-CM | POA: Diagnosis not present

## 2018-11-14 DIAGNOSIS — I951 Orthostatic hypotension: Secondary | ICD-10-CM

## 2018-11-14 DIAGNOSIS — R262 Difficulty in walking, not elsewhere classified: Secondary | ICD-10-CM | POA: Diagnosis not present

## 2018-11-14 DIAGNOSIS — M810 Age-related osteoporosis without current pathological fracture: Secondary | ICD-10-CM

## 2018-11-14 DIAGNOSIS — F329 Major depressive disorder, single episode, unspecified: Secondary | ICD-10-CM

## 2018-11-14 DIAGNOSIS — F32A Depression, unspecified: Secondary | ICD-10-CM

## 2018-11-14 DIAGNOSIS — L299 Pruritus, unspecified: Secondary | ICD-10-CM

## 2018-11-14 LAB — CBC WITH DIFFERENTIAL/PLATELET
Basophils Absolute: 0.1 10*3/uL (ref 0.0–0.1)
Basophils Relative: 0.9 % (ref 0.0–3.0)
Eosinophils Absolute: 0.1 10*3/uL (ref 0.0–0.7)
Eosinophils Relative: 0.9 % (ref 0.0–5.0)
HCT: 43.7 % (ref 36.0–46.0)
Hemoglobin: 14.6 g/dL (ref 12.0–15.0)
Lymphocytes Relative: 33.7 % (ref 12.0–46.0)
Lymphs Abs: 2.5 10*3/uL (ref 0.7–4.0)
MCHC: 33.4 g/dL (ref 30.0–36.0)
MCV: 88.2 fl (ref 78.0–100.0)
Monocytes Absolute: 0.5 10*3/uL (ref 0.1–1.0)
Monocytes Relative: 6.7 % (ref 3.0–12.0)
Neutro Abs: 4.3 10*3/uL (ref 1.4–7.7)
Neutrophils Relative %: 57.8 % (ref 43.0–77.0)
Platelets: 240 10*3/uL (ref 150.0–400.0)
RBC: 4.96 Mil/uL (ref 3.87–5.11)
RDW: 14.2 % (ref 11.5–15.5)
WBC: 7.5 10*3/uL (ref 4.0–10.5)

## 2018-11-14 LAB — COMPREHENSIVE METABOLIC PANEL
ALT: 12 U/L (ref 0–35)
AST: 16 U/L (ref 0–37)
Albumin: 3.9 g/dL (ref 3.5–5.2)
Alkaline Phosphatase: 88 U/L (ref 39–117)
BUN: 15 mg/dL (ref 6–23)
CO2: 31 mEq/L (ref 19–32)
Calcium: 9.5 mg/dL (ref 8.4–10.5)
Chloride: 103 mEq/L (ref 96–112)
Creatinine, Ser: 0.77 mg/dL (ref 0.40–1.20)
GFR: 71.01 mL/min (ref >60.00)
Glucose, Bld: 82 mg/dL (ref 70–99)
Potassium: 4.8 mEq/L (ref 3.5–5.1)
Sodium: 141 mEq/L (ref 135–145)
Total Bilirubin: 0.5 mg/dL (ref 0.2–1.2)
Total Protein: 6.1 g/dL (ref 6.0–8.3)

## 2018-11-14 LAB — LIPID PANEL
Cholesterol: 211 mg/dL — ABNORMAL HIGH (ref 0–200)
HDL: 59.2 mg/dL (ref >39.00)
LDL Cholesterol: 132 mg/dL — ABNORMAL HIGH (ref 0–99)
NonHDL: 151.81
Total CHOL/HDL Ratio: 4
Triglycerides: 99 mg/dL (ref 0.0–149.0)
VLDL: 19.8 mg/dL (ref 0.0–40.0)

## 2018-11-14 MED ORDER — DENOSUMAB 60 MG/ML ~~LOC~~ SOSY
60.0000 mg | PREFILLED_SYRINGE | Freq: Once | SUBCUTANEOUS | Status: AC
  Administered 2018-11-14: 60 mg via SUBCUTANEOUS

## 2018-11-14 MED ORDER — CLOBETASOL PROPIONATE EMULSION 0.05 % EX FOAM: CUTANEOUS | 0 refills | Status: DC

## 2018-11-14 MED ORDER — ESCITALOPRAM OXALATE 10 MG PO TABS: ORAL_TABLET | ORAL | 3 refills | Status: DC

## 2018-11-14 NOTE — Progress Notes (Signed)
Mariah Bryant is a 85 y.o. female is here for follow up.  History of Present Illness:   HPI: Depression worsening due to COVID isolation. [place]. Did well when in [place] over the summer since the weather was nicer and more people wore masks. Feels no motivation. Getting weaker. Use to walk. [place] with patient - [place] of the [place].  Midback lesion that has been watched. No itch or change.  Dog bite right forearm healing but wants looked at.  Morning heart pounding, irregular, lasting 10 minutes. Previously on BB but stopped due to hypotension.   Health Maintenance Due  Topic Date Due  . INFLUENZA VACCINE  09/30/2018   Depression screen [facility] 2/9 11/14/2018 08/15/2017  Decreased Interest 3 0  Down, Depressed, Hopeless 3 0  PHQ - 2 Score 6 0  Altered sleeping 3 -  Tired, decreased energy 3 -  Change in appetite 0 -  Feeling bad or failure about yourself  2 -  Trouble concentrating 2 -  Moving slowly or fidgety/restless 0 -  Suicidal thoughts 0 -  PHQ-9 Score 16 -   PMHx, SurgHx, SocialHx, FamHx, Medications, and Allergies were reviewed in the Visit Navigator and updated as appropriate.   Patient Active Problem List   Diagnosis Date Noted  . RLS (restless legs syndrome) 08/27/2017  . Chronic toe pain, right third toe 08/27/2017  . 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 Macular degeneration of both eyes 08/27/2017  . Difficulty walking 08/27/2017  . Hearing loss, with bilateral hearing aids obtained [facility] 08/19/2017  . Osteoporosis, on Prolia 08/16/2017  . Chronic pain of left knee 08/16/2017  . Chronic left-sided low back pain without sciatica 08/16/2017  . Macular degeneration 08/16/2017  . Orthostatic intolerance 08/16/2017  . Aortic atherosclerosis ([place]) 08/16/2017  . Degenerative arthritis of lumbar spine 08/16/2017  . Allergic rhinitis, on  Flonase prn 08/15/2017  . PAF (paroxysmal atrial fibrillation) ([place]) 05/05/2017  . Status post total right knee replacement 09/17/2015  . Atrial paroxysmal tachycardia ([place]) 12/06/2012  . Brown's syndrome, with double vision 10/07/2010  . Hiatal hernia, without GERD 10/07/2010  . Mitral valve prolapse 10/07/2010  . Seborrheic dermatitis, controlled with Aloe 10/07/2010  . Temporomandibular joint disorder, right side 10/07/2010  . Mild persistent asthma, on daily Pulmicort 07/17/2009  . Depressive disorder, on Lexapro 07/17/2009  . Hypercholesterolemia, no statin 07/17/2009  . Osteoarthritis 07/17/2009   Social History   Tobacco Use  . Smoking status: Never Smoker  . Smokeless tobacco: Never Used  Substance Use Topics  . Alcohol use: Yes    Alcohol/week: 1.0 standard drinks    Types: 1 Glasses of wine per week    Comment: occasionally  . Drug use: Never   Current Medications and Allergies   Current Outpatient Medications:  .  albuterol (PROVENTIL HFA;VENTOLIN HFA) 108 (90 Base) MCG/ACT inhaler, Inhale 2 puffs into the lungs every 4 (four) hours as needed for wheezing or shortness of breath., Disp: , Rfl:  .  apixaban (ELIQUIS) 5 MG TABS tablet, Take 1 tablet (5 mg total) by mouth 2 (two) times daily., Disp: 60 tablet, Rfl: 10 .  budesonide (PULMICORT FLEXHALER) 180 MCG/ACT inhaler, Take 2 puffs by mouth 2 (two) times daily as needed., Disp: , Rfl:  .  cholecalciferol (VITAMIN D) 1000 units tablet, Take 1,000 Units by mouth daily., Disp: , Rfl:  .  cyanocobalamin 1000 MCG tablet, Take 1,000 mcg by mouth daily., Disp: , Rfl:  .  denosumab (PROLIA) 60 MG/ML SOSY injection, Inject 60 mg into the skin every 6 (six) months., Disp: , Rfl:  .  escitalopram (LEXAPRO) 20 MG tablet, Take 10 mg by mouth daily. , Disp: , Rfl:  .  fluticasone (FLONASE) 50 MCG/ACT nasal spray, Place 2 sprays into both nostrils 2 (two) times daily as needed for allergies or rhinitis., Disp: , Rfl:  .  Multiple  Vitamins-Minerals (PRESERVISION AREDS 2 PO), Take 1 tablet by mouth 2 (two) times daily., Disp: , Rfl:  .  Clobetasol Propionate Emulsion 0.05 % topical foam, Apply to scalp nightly prn itch., Disp: 100 g, Rfl: 0 .  escitalopram (LEXAPRO) 10 MG tablet, Take 1.5 tab daily, Disp: 45 tablet, Rfl: 3   Allergies  Allergen Reactions  . Amoxicillin Rash  . Epinephrine Palpitations  . Shellfish Allergy Anaphylaxis  . Morphine And Related Other (See Comments)    Irregular heart beat  . Atarax [Hydroxyzine] Anxiety  . Biaxin [Clarithromycin] Rash  . Codeine Palpitations  . Doxycycline Rash  . Novocain [Procaine] Palpitations  . Penicillins Rash   Review of Systems   Pertinent items are noted in the HPI. Otherwise, a complete ROS is negative.  Vitals   Vitals:   11/14/18 1012  BP: 130/78  Pulse: 68  Temp: 98 F (36.7 C)  TempSrc: Temporal  SpO2: 96%  Weight: 155 lb 6.4 oz (70.5 kg)  Height: 5\' 1"  (1.549 m)     Body mass index is 29.36 kg/m.  Physical Exam   Physical Exam Vitals signs and nursing note reviewed.  HENT:     Head: Normocephalic and atraumatic.  Eyes:     Pupils: Pupils are equal, round, and reactive to light.  Neck:     Musculoskeletal: Normal range of motion and neck supple.  Cardiovascular:     Rate and Rhythm: Normal rate and regular rhythm.     Heart sounds: Normal heart sounds.  Pulmonary:     Effort: Pulmonary effort is normal.  Abdominal:     Palpations: Abdomen is soft.  Skin:    General: Skin is warm.     Comments: 1. SK mid back, 2. Healed bite with some scar tissue, 3. Scalp irritation without signs of infection or cyst.  Psychiatric:        Behavior: Behavior normal.    Assessment and Plan   [name] was seen today for follow-up.  Diagnoses and all orders for this visit:  Depressive disorder, on Lexapro Comments: Increase to 1.5 tab daily. Discussed exercise, safe socialization. Orders: -     escitalopram (LEXAPRO) 10 MG tablet; Take  1.5 tab daily  Flu vaccine need -     Flu Vaccine QUAD High Dose(Fluad)  Itchy scalp -     Clobetasol Propionate Emulsion 0.05 % topical foam; Apply to scalp nightly prn itch.  PAF (paroxysmal atrial fibrillation) ([employer]) Comments: On Eliquis. Q am symptoms.   Osteoporosis without current pathological fracture, unspecified osteoporosis type -     denosumab (PROLIA) injection 60 mg  Difficulty walking Comments: Offered PT but they declined at this time.   Orthostatic intolerance -     CBC with Differential/Platelet  Hypercholesterolemia, no statin -     Comprehensive metabolic panel -     Lipid panel    . Orders and follow up as documented in [facility], reviewed diet, exercise and weight control, cardiovascular risk and specific lipid/LDL goals  reviewed, reviewed medications and side effects in detail.  . Reviewed expectations re: course of current medical issues. . Outlined signs and symptoms indicating need for more acute intervention. . Patient verbalized understanding and all questions were answered. . Patient received an After Visit Summary.  Briscoe Deutscher, DO [employer], [facility] 11/14/2018

## 2018-11-30 ENCOUNTER — Emergency Department (HOSPITAL_COMMUNITY)
Admission: EM | Admit: 2018-11-30 | Discharge: 2018-11-30 | Disposition: A | Discharge status: Home / Self Care | Payer: Medicare Other | Attending: Emergency Medicine | Admitting: Emergency Medicine

## 2018-11-30 ENCOUNTER — Encounter (HOSPITAL_COMMUNITY): Payer: Self-pay

## 2018-11-30 ENCOUNTER — Other Ambulatory Visit: Payer: Self-pay

## 2018-11-30 ENCOUNTER — Emergency Department (HOSPITAL_COMMUNITY): Payer: Medicare Other

## 2018-11-30 DIAGNOSIS — R457 State of emotional shock and stress, unspecified: Secondary | ICD-10-CM | POA: Diagnosis not present

## 2018-11-30 DIAGNOSIS — R519 Headache, unspecified: Secondary | ICD-10-CM | POA: Diagnosis not present

## 2018-11-30 DIAGNOSIS — R410 Disorientation, unspecified: Secondary | ICD-10-CM | POA: Insufficient documentation

## 2018-11-30 DIAGNOSIS — Z79899 Other long term (current) drug therapy: Secondary | ICD-10-CM | POA: Insufficient documentation

## 2018-11-30 DIAGNOSIS — R4020 Unspecified coma: Secondary | ICD-10-CM | POA: Diagnosis not present

## 2018-11-30 DIAGNOSIS — Z7901 Long term (current) use of anticoagulants: Secondary | ICD-10-CM | POA: Insufficient documentation

## 2018-11-30 DIAGNOSIS — I4891 Unspecified atrial fibrillation: Secondary | ICD-10-CM | POA: Diagnosis not present

## 2018-11-30 DIAGNOSIS — R000 Tachycardia, unspecified: Secondary | ICD-10-CM | POA: Diagnosis not present

## 2018-11-30 LAB — CBC WITH DIFFERENTIAL/PLATELET
Abs Immature Granulocytes: 0.01 10*3/uL (ref 0.00–0.07)
Basophils Absolute: 0.1 10*3/uL (ref 0.0–0.1)
Basophils Relative: 1 %
Eosinophils Absolute: 0.1 10*3/uL (ref 0.0–0.5)
Eosinophils Relative: 2 %
HCT: 41.9 % (ref 36.0–46.0)
Hemoglobin: 14.1 g/dL (ref 12.0–15.0)
Immature Granulocytes: 0 %
Lymphocytes Relative: 34 %
Lymphs Abs: 1.9 10*3/uL (ref 0.7–4.0)
MCH: 30.1 pg (ref 26.0–34.0)
MCHC: 33.7 g/dL (ref 30.0–36.0)
MCV: 89.5 fL (ref 80.0–100.0)
Monocytes Absolute: 0.4 10*3/uL (ref 0.1–1.0)
Monocytes Relative: 7 %
Neutro Abs: 3.1 10*3/uL (ref 1.7–7.7)
Neutrophils Relative %: 56 %
Platelets: 220 10*3/uL (ref 150–400)
RBC: 4.68 MIL/uL (ref 3.87–5.11)
RDW: 13.6 % (ref 11.5–15.5)
WBC: 5.5 10*3/uL (ref 4.0–10.5)
nRBC: 0 % (ref 0.0–0.2)

## 2018-11-30 LAB — BASIC METABOLIC PANEL
Anion gap: 7 (ref 5–15)
BUN: 9 mg/dL (ref 8–23)
CO2: 26 mmol/L (ref 22–32)
Calcium: 8.4 mg/dL — ABNORMAL LOW (ref 8.9–10.3)
Chloride: 108 mmol/L (ref 98–111)
Creatinine, Ser: 0.71 mg/dL (ref 0.44–1.00)
GFR calc Af Amer: >60 mL/min (ref >60)
GFR calc non Af Amer: >60 mL/min (ref >60)
Glucose, Bld: 88 mg/dL (ref 70–99)
Potassium: 4.1 mmol/L (ref 3.5–5.1)
Sodium: 141 mmol/L (ref 135–145)

## 2018-11-30 MED ORDER — SODIUM CHLORIDE 0.9 % IV BOLUS
500.0000 mL | Freq: Once | INTRAVENOUS | Status: AC
  Administered 2018-11-30: 500 mL via INTRAVENOUS

## 2018-11-30 NOTE — Discharge Instructions (Signed)
Continue medications as previously prescribed.  Return to the emergency department if you develop worsening symptoms, or other new and concerning symptoms.

## 2018-11-30 NOTE — ED Triage Notes (Signed)
Per [facility], Pt woke up at 10 this morning with pressure on top of her head. Pt felt worried and called ems. Stroke scale negative per [facility], VS stable. Pt states she feels that she is under a lot of stress due to the lock down and became very anxious .

## 2018-11-30 NOTE — ED Provider Notes (Signed)
[facility] EMERGENCY DEPARTMENT Provider Note   CSN: [id number] Arrival date & time: 11/30/18  1125     History   Chief Complaint Chief Complaint  Patient presents with  . Anxiety    HPI Mariah Bryant is a 85 y.o. female.     Patient is an 85 year old female with past medical history of A. fib, GERD, hyperlipidemia.  She presents today for evaluation of an episode that occurred shortly upon waking this morning.  Patient went to bed last night feeling fine, however this morning woke up with a pressure in her head with associated disorientation and confusion.  This episode lasted for approximately 15 to 20 minutes, then resolved after EMS arrived.  According to the daughter at bedside, the patient "yelled out" to her and informed her of how she was feeling.  The daughter feels as though she seemed disoriented and had difficulty expressing herself, however believes that she is now back to her baseline.  Daughter does report a similar episode 3 or 4 years ago for which she was evaluated, however no definitive cause was found.  The history is provided by the patient.    Past Medical History:  Diagnosis Date  . Allergic rhinitis 08/15/2017  . Asthma, stable, moderate persistent 07/17/2009  . Atrial fibrillation ([place])   . 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 Chronic pain of left knee 08/16/2017  . Depressive disorder 07/17/2009  . Gastro-esophageal reflux disease without esophagitis 10/07/2010  . Hiatal hernia 10/07/2010  . Hypercholesterolemia 07/17/2009  . Hypogammaglobulinemia ([place]) 08/15/2017  . Macular degeneration 08/16/2017  . Orthostatic intolerance 08/16/2017  . Osteoarthritis 07/17/2009  . Osteoporosis 08/16/2017  . Seborrheic dermatitis 10/07/2010  . Sleep apnea 07/17/2009  . Temporomandibular joint disorder 10/07/2010    Patient Active Problem List   Diagnosis Date Noted  . RLS (restless legs syndrome) 08/27/2017  . Chronic toe pain, right third toe 08/27/2017  .  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 Macular degeneration of both eyes 08/27/2017  . Difficulty walking 08/27/2017  . Hearing loss, with bilateral hearing aids obtained from [facility] 08/19/2017  . Osteoporosis, on Prolia 08/16/2017  . Chronic pain of left knee 08/16/2017  . Chronic left-sided low back pain without sciatica 08/16/2017  . Macular degeneration 08/16/2017  . Orthostatic intolerance 08/16/2017  . Aortic atherosclerosis ([place]) 08/16/2017  . Degenerative arthritis of lumbar spine 08/16/2017  . Allergic rhinitis, on Flonase prn 08/15/2017  . PAF (paroxysmal atrial fibrillation) ([place]) 05/05/2017  . Status post total right knee replacement 09/17/2015  . Atrial paroxysmal tachycardia ([place]) 12/06/2012  . Brown's syndrome, with double vision 10/07/2010  . Hiatal hernia, without GERD 10/07/2010  . Mitral valve prolapse 10/07/2010  . Seborrheic dermatitis, controlled with Aloe 10/07/2010  . Temporomandibular joint disorder, right side 10/07/2010  . Mild persistent asthma, on daily Pulmicort 07/17/2009  . Depressive disorder, on Lexapro 07/17/2009  . Hypercholesterolemia, no statin 07/17/2009  . Osteoarthritis 07/17/2009    Past Surgical History:  Procedure Laterality Date  . APPENDECTOMY    . CATARACT EXTRACTION, BILATERAL    . CHOLECYSTECTOMY    . HYSTERECTOMY ABDOMINAL WITH SALPINGO-OOPHORECTOMY    . REPLACEMENT TOTAL KNEE       OB History   No obstetric history on file.      Home Medications    Prior to Admission medications   Medication Sig Start Date End Date Taking? Authorizing Provider  albuterol (PROVENTIL HFA;VENTOLIN HFA) 108 (90 Base) MCG/ACT  inhaler Inhale 2 puffs into the lungs every 4 (four) hours as needed for wheezing or shortness of breath.    [provider]  apixaban (ELIQUIS) 5 MG TABS tablet Take 1 tablet (5 mg total) by mouth 2 (two) times daily. 02/09/18   Deboraha Sprang, MD   budesonide (PULMICORT FLEXHALER) 180 MCG/ACT inhaler Take 2 puffs by mouth 2 (two) times daily as needed. 08/02/12   [provider]  cholecalciferol (VITAMIN D) 1000 units tablet Take 1,000 Units by mouth daily.    [provider]  Clobetasol Propionate Emulsion 0.05 % topical foam Apply to scalp nightly prn itch. 11/14/18   Briscoe Deutscher, DO  cyanocobalamin 1000 MCG tablet Take 1,000 mcg by mouth daily.    [provider]  denosumab (PROLIA) 60 MG/ML SOSY injection Inject 60 mg into the skin every 6 (six) months.    [provider]  escitalopram (LEXAPRO) 10 MG tablet Take 1.5 tab daily 11/14/18   Briscoe Deutscher, DO  escitalopram (LEXAPRO) 20 MG tablet Take 10 mg by mouth daily.     [provider]  fluticasone (FLONASE) 50 MCG/ACT nasal spray Place 2 sprays into both nostrils 2 (two) times daily as needed for allergies or rhinitis.    [provider]  Multiple Vitamins-Minerals (PRESERVISION AREDS 2 PO) Take 1 tablet by mouth 2 (two) times daily.    [provider]    Family History Family History  Problem Relation Age of Onset  . Stroke Mother   . Cancer Sister   . Cancer Brother     Social History Social History   Tobacco Use  . Smoking status: Never Smoker  . Smokeless tobacco: Never Used  Substance Use Topics  . Alcohol use: Yes    Alcohol/week: 1.0 standard drinks    Types: 1 Glasses of wine per week    Comment: occasionally  . Drug use: Never     Allergies   Amoxicillin, Epinephrine, Shellfish allergy, Morphine and related, Atarax [hydroxyzine], Biaxin [clarithromycin], Codeine, Doxycycline, Novocain [procaine], and Penicillins   Review of Systems Review of Systems  All other systems reviewed and are negative.    Physical Exam Updated Vital Signs BP 132/62   Pulse 61   Temp 97.6 F (36.4 C) (Oral)   Resp 14   SpO2 100%   Physical Exam Vitals signs and nursing note reviewed.  Constitutional:       General: She is not in acute distress.    Appearance: She is well-developed. She is not diaphoretic.  HENT:     Head: Normocephalic and atraumatic.  Neck:     Musculoskeletal: Normal range of motion and neck supple.  Cardiovascular:     Rate and Rhythm: Normal rate and regular rhythm.     Heart sounds: No murmur. No friction rub. No gallop.   Pulmonary:     Effort: Pulmonary effort is normal. No respiratory distress.     Breath sounds: Normal breath sounds. No wheezing.  Abdominal:     General: Bowel sounds are normal. There is no distension.     Palpations: Abdomen is soft.     Tenderness: There is no abdominal tenderness.  Musculoskeletal: Normal range of motion.  Skin:    General: Skin is warm and dry.  Neurological:     General: No focal deficit present.     Mental Status: She is alert and oriented to person, place, and time.     Cranial Nerves: No cranial nerve deficit.  Motor: No weakness.     Coordination: Coordination abnormal.     Comments: Patient does have an intention tremor and dysmetria, which according to the patient and daughter is her norm.      ED Treatments / Results  Labs (all labs ordered are listed, but only abnormal results are displayed) Labs Reviewed  BASIC METABOLIC PANEL  CBC WITH DIFFERENTIAL/PLATELET    EKG EKG Interpretation  Date/Time:  Thursday November 30 2018 11:30:11 EDT Ventricular Rate:  62 PR Interval:    QRS Duration: 94 QT Interval:  425 QTC Calculation: 432 R Axis:   37 Text Interpretation:  Sinus rhythm Atrial premature complex Low voltage, precordial leads No prior ecg for comparison Confirmed by Veryl Speak [phone]) on 11/30/2018 11:57:45 AM   Radiology No results found.  Procedures Procedures (including critical care time)  Medications Ordered in ED Medications  sodium chloride 0.9 % bolus 500 mL (has no administration in time range)     Initial Impression / Assessment and Plan / ED Course  I have  reviewed the triage vital signs and the nursing notes.  Pertinent labs & imaging results that were available during my care of the patient were reviewed by me and considered in my medical decision making (see chart for details).  Patient presenting with complaints of an episode of headache and confusion as described above.  Patient's neurologic exam at presentation is unremarkable and has remained nonfocal throughout her emergency department course.  Her laboratory studies and CT scan are both unremarkable.  She is now back to her baseline and has no complaints.  At this point, I feel as though patient is suitable for discharge.  She is to follow-up with primary doctor and return as needed.  Final Clinical Impressions(s) / ED Diagnoses   Final diagnoses:  None    ED Discharge Orders    None       Veryl Speak, MD 11/30/18 1419

## 2018-12-07 ENCOUNTER — Other Ambulatory Visit: Payer: Self-pay | Admitting: Family Medicine

## 2018-12-07 DIAGNOSIS — F32A Depression, unspecified: Secondary | ICD-10-CM

## 2018-12-07 DIAGNOSIS — F329 Major depressive disorder, single episode, unspecified: Secondary | ICD-10-CM

## 2018-12-22 ENCOUNTER — Encounter: Payer: Self-pay | Admitting: Internal Medicine

## 2018-12-22 ENCOUNTER — Telehealth (INDEPENDENT_AMBULATORY_CARE_PROVIDER_SITE_OTHER): Payer: Medicare Other | Admitting: Internal Medicine

## 2018-12-22 ENCOUNTER — Other Ambulatory Visit: Payer: Self-pay

## 2018-12-22 VITALS — Ht 61.0 in | Wt 153.0 lb

## 2018-12-22 DIAGNOSIS — I471 Supraventricular tachycardia: Secondary | ICD-10-CM | POA: Diagnosis not present

## 2018-12-22 DIAGNOSIS — I951 Orthostatic hypotension: Secondary | ICD-10-CM

## 2018-12-22 NOTE — Progress Notes (Signed)
Electrophysiology TeleHealth Note   Due to national recommendations of social distancing due to COVID 19, an audio/video telehealth visit is felt to be most appropriate for this patient at this time.  See MyChart message from today for the patient's consent to telehealth for [facility].   Date:  12/22/2018   ID:  [name], DOB August 02, 1930, MRN [id number]  Location: patient's home  Provider location: [address], [place]  Evaluation Performed: Follow-up visit  PCP:  Briscoe Deutscher, DO  Cardiologist:   Electrophysiologist:  SK   Chief Complaint: orthostasis and afib  History of Present Illness:    Mariah Bryant is a 85 y.o. female who presents via audio/video conferencing for a telehealth visit today.  Since last being seen in our clinic for  for atrial fibrillation with a combination of apixaban and metoprolol and orthostatic intolerance   the patient reports doing very well   .  She was diagnosed in [place].  She was recently seen by an EP physician who recommended cryoablation for symptomatic palpitations which seemed primarily orthostatic.  She had other symptoms and objective evidence for orthostatic intolerance.  At the last visit we discussed the physiology of orthostatic intolerance and various nonpharmacological maneuvers.      DATE TEST EF   3//14 Echo   60-65 %   6/17 Echo   70 % LA normal        Date Cr K Hgb  10/18 0.75 4.8 14.9   10/20 0.71 4.1 14.1   No bleeding no orthostasis   Episodes of flashing lights--with bending over ;  Her ophthomologist is at [facility] Had a good trip to   The patient denies symptoms of fevers, chills, cough, or new SOB worrisome for COVID 19.    Past Medical History:  Diagnosis Date  . Allergic rhinitis 08/15/2017  . Asthma, stable, moderate persistent 07/17/2009  . Atrial fibrillation ([place])   . 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 Chronic pain of left knee 08/16/2017  . Depressive  disorder 07/17/2009  . Gastro-esophageal reflux disease without esophagitis 10/07/2010  . Hiatal hernia 10/07/2010  . Hypercholesterolemia 07/17/2009  . Hypogammaglobulinemia ([place]) 08/15/2017  . Macular degeneration 08/16/2017  . Orthostatic intolerance 08/16/2017  . Osteoarthritis 07/17/2009  . Osteoporosis 08/16/2017  . Seborrheic dermatitis 10/07/2010  . Sleep apnea 07/17/2009  . Temporomandibular joint disorder 10/07/2010    Past Surgical History:  Procedure Laterality Date  . APPENDECTOMY    . CATARACT EXTRACTION, BILATERAL    . CHOLECYSTECTOMY    . HYSTERECTOMY ABDOMINAL WITH SALPINGO-OOPHORECTOMY    . REPLACEMENT TOTAL KNEE      Current Outpatient Medications  Medication Sig Dispense Refill  . albuterol (PROVENTIL HFA;VENTOLIN HFA) 108 (90 Base) MCG/ACT inhaler Inhale 2 puffs into the lungs every 4 (four) hours as needed for wheezing or shortness of breath.    [name] apixaban (ELIQUIS) 5 MG TABS tablet Take 1 tablet (5 mg total) by mouth 2 (two) times daily. 60 tablet 10  . budesonide (PULMICORT FLEXHALER) 180 MCG/ACT inhaler Take 2 puffs by mouth 2 (two) times daily as needed.    . cholecalciferol (VITAMIN D) 1000 units tablet Take 1,000 Units by mouth daily.    . Clobetasol Propionate Emulsion 0.05 % topical foam Apply to scalp nightly prn itch. 100 g 0  . denosumab (PROLIA) 60 MG/ML SOSY injection Inject 60 mg into the skin every 6 (six) months.    . fluticasone (FLONASE) 50 MCG/ACT nasal spray Place  2 sprays into both nostrils 2 (two) times daily as needed for allergies or rhinitis.    . Multiple Vitamins-Minerals (PRESERVISION AREDS 2 PO) Take 1 tablet by mouth 2 (two) times daily.    [name] escitalopram (LEXAPRO) 10 MG tablet TAKE 1.5 TABLETS BY MOUTH DAILY 135 tablet 2   No current facility-administered medications for this visit.     Allergies:   Amoxicillin, Epinephrine, Shellfish allergy, Morphine and related, Atarax [hydroxyzine], Biaxin [clarithromycin], Codeine, Doxycycline, Novocain  [procaine], and Penicillins   Social History:  The patient  reports that she has never smoked. She has never used smokeless tobacco. She reports current alcohol use of about 1.0 standard drinks of alcohol per week. She reports that she does not use drugs.   Family History:  The patient's   family history includes Cancer in her brother and sister; Stroke in her mother.   ROS:  Please see the history of present illness.   All other systems are personally reviewed and negative.    Exam:    Vital Signs:  Ht 5\' 1"  (1.549 m)   Wt 153 lb (69.4 kg)   BMI 28.91 kg/m     Labs/Other Tests and Data Reviewed:    Recent Labs: 11/14/2018: ALT 12 11/30/2018: BUN 9; Creatinine, Ser 0.71; Hemoglobin 14.1; Platelets 220; Potassium 4.1; Sodium 141   Wt Readings from Last 3 Encounters:  12/22/18 153 lb (69.4 kg)  11/14/18 155 lb 6.4 oz (70.5 kg)  01/10/18 157 lb 3.2 oz (71.3 kg)     Other studies personally reviewed:     ASSESSMENT & PLAN:    Atrial fibrillation-paroxysmal  Falls  Orthostatic hypotension   Eyes Flashes  On Anticoagulation;  No bleeding issues   No lightheadedness  Have suggested she call the [facility]    COVID 19 screen The patient denies symptoms of COVID 19 at this time.  The importance of social distancing was discussed today.  Follow-up:  6 m    Current medicines are reviewed at length with the patient today.   The patient does not have concerns regarding her medicines.  The following changes were made today:  none  Labs/ tests ordered today include:   No orders of the defined types were placed in this encounter.   Future tests ( post COVID )     Patient Risk:  after full review of this patients clinical status, I feel that they are at moderate  risk at this time.  Today, I have spent 10 minutes with the patient with telehealth technology discussing the above.  Signed, [name], MD  12/22/2018 5:36 PM     [address] [place]  [id number] [phone] (office) [phone] (fax)

## 2018-12-25 ENCOUNTER — Telehealth: Payer: Medicare Other | Admitting: Internal Medicine

## 2019-01-31 DIAGNOSIS — L905 Scar conditions and fibrosis of skin: Secondary | ICD-10-CM | POA: Diagnosis not present

## 2019-01-31 DIAGNOSIS — L723 Sebaceous cyst: Secondary | ICD-10-CM | POA: Diagnosis not present

## 2019-01-31 DIAGNOSIS — L812 Freckles: Secondary | ICD-10-CM | POA: Diagnosis not present

## 2019-01-31 DIAGNOSIS — D1801 Hemangioma of skin and subcutaneous tissue: Secondary | ICD-10-CM | POA: Diagnosis not present

## 2019-01-31 DIAGNOSIS — L853 Xerosis cutis: Secondary | ICD-10-CM | POA: Diagnosis not present

## 2019-01-31 DIAGNOSIS — L821 Other seborrheic keratosis: Secondary | ICD-10-CM | POA: Diagnosis not present

## 2019-01-31 DIAGNOSIS — Z85828 Personal history of other malignant neoplasm of skin: Secondary | ICD-10-CM | POA: Diagnosis not present

## 2019-01-31 DIAGNOSIS — L708 Other acne: Secondary | ICD-10-CM | POA: Diagnosis not present

## 2019-02-20 ENCOUNTER — Other Ambulatory Visit: Payer: Self-pay

## 2019-02-20 MED ORDER — APIXABAN 5 MG PO TABS: 5.0000 mg | ORAL_TABLET | Freq: Two times a day (BID) | ORAL | 5 refills | Status: DC

## 2019-02-20 NOTE — Telephone Encounter (Signed)
Prescription refill request for Eliquis received.  Last office visit: 12/22/2018, klein Scr: 0.71, 11/30/2018 Age: 85 y.o. Weight: 70.5 kg   Prescription refill sent.

## 2019-02-20 NOTE — Telephone Encounter (Signed)
Pt called in requesting a refill on her Eliquis.

## 2019-03-07 ENCOUNTER — Encounter: Payer: Self-pay | Admitting: Physician Assistant

## 2019-03-13 ENCOUNTER — Telehealth: Payer: Self-pay

## 2019-03-13 NOTE — Telephone Encounter (Signed)
Patient would like to know if she should receive the covid-19. Would like for clinical staff to call her back today

## 2019-03-26 ENCOUNTER — Encounter: Payer: Self-pay | Admitting: Physician Assistant

## 2019-03-26 ENCOUNTER — Ambulatory Visit (INDEPENDENT_AMBULATORY_CARE_PROVIDER_SITE_OTHER): Payer: Medicare Other | Admitting: Physician Assistant

## 2019-03-26 DIAGNOSIS — R197 Diarrhea, unspecified: Secondary | ICD-10-CM

## 2019-03-26 NOTE — Progress Notes (Addendum)
Virtual Visit via Video   I connected with [name] on 03/26/19 at  2:20 PM EST by a video enabled telemedicine application and verified that I am speaking with the correct person using two identifiers. Location patient: Home Location provider: [facility], Office Persons participating in the virtual visit: Esmerelda Patin, [place] Dieckhoff (pt's daughter) Inda Coke PA-C, Anselmo Pickler, LPN   I discussed the limitations of evaluation and management by telemedicine and the availability of in person appointments. The patient expressed understanding and agreed to proceed.  I acted as a Education administrator for [employer], PA-C [employer], LPN  Subjective:   HPI:   Patient is requesting evaluation for possible COVID-19.  Symptom onset: one week ago Travel/contacts: No travel, or exposure that she is aware of .   Patient endorses the following symptoms: subjective fever, rhinorrhea, myalgia and diarrhea x 1 week, watery just coming out sat on the toilet for one hour today, nausea  Patient denies the following symptoms: sinus headache, sinus congestion, sore throat, shortness of breath, chest tightness and chest pain, rectal bleeding, dizziness, lightheadedness  This morning she was able to eat a piece of toast with almond butter and a banana with some tea, but soon had diarrhea afterwards.  She also reports that the dog in the home recently tested positive for Giardia.  She denies any recent intake of antibiotics.  Treatments tried: none  Patient risk factors: Current [id number] risk of complications score: 4 Smoking status: Jazia [place]  reports that she has never smoked. She has never used smokeless tobacco. If female, currently pregnant? []   Yes []   No  ROS: See pertinent positives and negatives per HPI.  Patient Active Problem List   Diagnosis Date Noted  . RLS (restless legs syndrome) 08/27/2017  . Chronic toe pain, right third toe 08/27/2017  . 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 Macular degeneration of both eyes 08/27/2017  . Difficulty walking 08/27/2017  . Hearing loss, with bilateral hearing aids obtained from [facility] 08/19/2017  . Osteoporosis, on Prolia 08/16/2017  . Chronic pain of left knee 08/16/2017  . Chronic left-sided low back pain without sciatica 08/16/2017  . Macular degeneration 08/16/2017  . Orthostatic intolerance 08/16/2017  . Aortic atherosclerosis ([place]) 08/16/2017  . Degenerative arthritis of lumbar spine 08/16/2017  . Allergic rhinitis, on Flonase prn 08/15/2017  . PAF (paroxysmal atrial fibrillation) ([place]) 05/05/2017  . Status post total right knee replacement 09/17/2015  . Atrial paroxysmal tachycardia ([place]) 12/06/2012  . Brown's syndrome, with double vision 10/07/2010  . Hiatal hernia, without GERD 10/07/2010  . Mitral valve prolapse 10/07/2010  . Seborrheic dermatitis, controlled with Aloe 10/07/2010  . Temporomandibular joint disorder, right side 10/07/2010  . Mild persistent asthma, on daily Pulmicort 07/17/2009  . Depressive disorder, on Lexapro 07/17/2009  . Hypercholesterolemia, no statin 07/17/2009  . Osteoarthritis 07/17/2009    Social History   Tobacco Use  . Smoking status: Never Smoker  . Smokeless tobacco: Never Used  Substance Use Topics  . Alcohol use: Yes    Alcohol/week: 1.0 standard drinks    Types: 1 Glasses of wine per week    Comment: occasionally    Current Outpatient Medications:  .  albuterol (PROVENTIL HFA;VENTOLIN HFA) 108 (90 Base) MCG/ACT inhaler, Inhale 2 puffs into the lungs every 4 (four) hours as needed for wheezing or shortness of breath., Disp: , Rfl:  .  apixaban (ELIQUIS) 5 MG TABS tablet, Take 1  tablet (5 mg total) by mouth 2 (two) times daily., Disp: 60 tablet, Rfl: 5 .  budesonide (PULMICORT FLEXHALER) 180 MCG/ACT inhaler, Take 2 puffs by mouth 2 (two) times daily as needed., Disp: , Rfl:  .  denosumab  (PROLIA) 60 MG/ML SOSY injection, Inject 60 mg into the skin every 6 (six) months., Disp: , Rfl:  .  escitalopram (LEXAPRO) 10 MG tablet, TAKE 1.5 TABLETS BY MOUTH DAILY, Disp: 135 tablet, Rfl: 2 .  fluticasone (FLONASE) 50 MCG/ACT nasal spray, Place 2 sprays into both nostrils 2 (two) times daily as needed for allergies or rhinitis., Disp: , Rfl:  .  Multiple Vitamins-Minerals (PRESERVISION AREDS 2 PO), Take 1 tablet by mouth 2 (two) times daily., Disp: , Rfl:   Allergies  Allergen Reactions  . Amoxicillin Rash  . Epinephrine Palpitations  . Shellfish Allergy Anaphylaxis  . Morphine And Related Other (See Comments)    Irregular heart beat  . Atarax [Hydroxyzine] Anxiety  . Biaxin [Clarithromycin] Rash  . Codeine Palpitations  . Doxycycline Rash  . Novocain [Procaine] Palpitations  . Penicillins Rash    Objective:   VITALS: Per patient if applicable, see vitals. GENERAL: Alert, appears well and in no acute distress. HEENT: Atraumatic, conjunctiva clear, no obvious abnormalities on inspection of external nose and ears. NECK: Normal movements of the head and neck. CARDIOPULMONARY: No increased WOB. Speaking in clear sentences. I:E ratio WNL.  MS: Moves all visible extremities without noticeable abnormality. PSYCH: Pleasant and cooperative, well-groomed. Speech normal rate and rhythm. Affect is appropriate. Insight and judgement are appropriate. Attention is focused, linear, and appropriate.  NEURO: CN grossly intact. Oriented as arrived to appointment on time with no prompting. Moves both UE equally.  SKIN: No obvious lesions, wounds, erythema, or cyanosis noted on face or hands.  Assessment and Plan:   Danyel was seen today for transfer of care.  Diagnoses and all orders for this visit:  Diarrhea of presumed infectious origin -     Ova and parasite examination; Future -     Clostridium Difficile by PCR; Future -     Clostridium difficile Toxin B, Qualitative, Real-Time  PCR([facility]); Future    Suspect possible viral illness, and I did recommend COVID-19 testing at this time.  Patient's daughter is on the call and would like to send patient to a drive up site so they are going to get tested at a [facility] and let us know the results.  We are also checking stool studies to assess for possible infectious etiology.  I reviewed all the worsening precautions for dehydration with patient and her daughter.  Discussed need to push fluids including fluids that contain electrolytes in order to keep her blood pressure stable and to prevent further dehydration.  I did discuss that if her p.o. intake decreases, or she develops dizziness or pre-syncope, she needs to go to urgent care or ER for further evaluation and likely IV fluids.  Patient and daughter verbalized understanding to plan.  We did discuss possibly treating for Giardia given her dog has Giardia, but patient would like to wait on stool studies for this.  As a precaution, they have been advised to remain home until COVID-19 results and then possible further quarantine after that based on results and symptoms. Advised if they experience a "second sickening" or worsening symptoms as the illness progresses, they are to call the office for further instructions or seek emergent evaluation for any severe symptoms.   . Reviewed expectations re: course of  current medical issues. . Discussed self-management of symptoms. . Outlined signs and symptoms indicating need for more acute intervention. . Patient verbalized understanding and all questions were answered. [name] Health Maintenance issues including appropriate healthy diet, exercise, and smoking avoidance were discussed with patient. . See orders for this visit as documented in the electronic medical record.  I discussed the assessment and treatment plan with the patient. The patient was provided an opportunity to ask questions and all were answered. The patient agreed with the plan  and demonstrated an understanding of the instructions.   The patient was advised to call back or seek an in-person evaluation if the symptoms worsen or if the condition fails to improve as anticipated.   CMA or LPN served as scribe during this visit. History, Physical, and Plan performed by medical provider. The above documentation has been reviewed and is accurate and complete.   [place], [place] 03/26/2019

## 2019-03-29 ENCOUNTER — Other Ambulatory Visit: Payer: Self-pay

## 2019-03-29 ENCOUNTER — Other Ambulatory Visit: Payer: Medicare Other

## 2019-03-29 DIAGNOSIS — R197 Diarrhea, unspecified: Secondary | ICD-10-CM

## 2019-04-03 LAB — OVA AND PARASITE EXAMINATION
CONCENTRATE RESULT:: NONE SEEN
MICRO NUMBER:: 10092912
SPECIMEN QUALITY:: ADEQUATE
TRICHROME RESULT:: NONE SEEN

## 2019-04-03 LAB — CLOSTRIDIUM DIFFICILE TOXIN B, QUALITATIVE, REAL-TIME PCR: Toxigenic C. Difficile by PCR: NOT DETECTED

## 2019-05-22 ENCOUNTER — Telehealth: Payer: Self-pay | Admitting: Physician Assistant

## 2019-05-22 NOTE — Telephone Encounter (Signed)
Spoke to pt daughter. Pt will be out of town for over a month and will call back to get scheduled for Medicare Annual Wellness Visit (AWV) either virtually/audio only OR in office. Whatever the patients preference is.  No hx; please schedule at anytime with [facility]-Nurse Health Advisor at [facility].

## 2019-06-13 ENCOUNTER — Telehealth: Payer: Self-pay | Admitting: Physician Assistant

## 2019-06-13 NOTE — Telephone Encounter (Signed)
Patient called in asking for [name]'s advice, she states she was seen by [name] and was given a set of exercises to do at home, Mariah Bryant says she went to see the orthopedic and was told that she has a tear in her shoulder -wanted to know if she needs to continue the exercises or needs to come be evaluated again.

## 2019-06-28 ENCOUNTER — Encounter: Payer: Self-pay | Admitting: Physician Assistant

## 2019-08-04 DIAGNOSIS — W228XXA Striking against or struck by other objects, initial encounter: Secondary | ICD-10-CM | POA: Diagnosis not present

## 2019-08-04 DIAGNOSIS — Z7901 Long term (current) use of anticoagulants: Secondary | ICD-10-CM | POA: Diagnosis not present

## 2019-08-04 DIAGNOSIS — Z880 Allergy status to penicillin: Secondary | ICD-10-CM | POA: Diagnosis not present

## 2019-08-04 DIAGNOSIS — S6992XA Unspecified injury of left wrist, hand and finger(s), initial encounter: Secondary | ICD-10-CM | POA: Diagnosis not present

## 2019-08-04 DIAGNOSIS — S61412A Laceration without foreign body of left hand, initial encounter: Secondary | ICD-10-CM | POA: Diagnosis not present

## 2019-08-04 DIAGNOSIS — S60222A Contusion of left hand, initial encounter: Secondary | ICD-10-CM | POA: Diagnosis not present

## 2019-09-06 DIAGNOSIS — I480 Paroxysmal atrial fibrillation: Secondary | ICD-10-CM | POA: Diagnosis not present

## 2019-09-06 DIAGNOSIS — R0602 Shortness of breath: Secondary | ICD-10-CM | POA: Diagnosis not present

## 2019-09-06 DIAGNOSIS — R002 Palpitations: Secondary | ICD-10-CM | POA: Diagnosis not present

## 2019-09-06 DIAGNOSIS — R5383 Other fatigue: Secondary | ICD-10-CM | POA: Diagnosis not present

## 2019-09-20 DIAGNOSIS — H50611 Brown's sheath syndrome, right eye: Secondary | ICD-10-CM | POA: Diagnosis not present

## 2019-09-20 DIAGNOSIS — Z961 Presence of intraocular lens: Secondary | ICD-10-CM | POA: Diagnosis not present

## 2019-09-20 DIAGNOSIS — H353221 Exudative age-related macular degeneration, left eye, with active choroidal neovascularization: Secondary | ICD-10-CM | POA: Diagnosis not present

## 2019-09-27 DIAGNOSIS — H353221 Exudative age-related macular degeneration, left eye, with active choroidal neovascularization: Secondary | ICD-10-CM | POA: Diagnosis not present

## 2019-10-01 ENCOUNTER — Other Ambulatory Visit: Payer: Self-pay | Admitting: Internal Medicine

## 2019-10-01 NOTE — Telephone Encounter (Signed)
Prescription refill request for Eliquis received.  Last office visit: Caryl Comes, 12/22/2018 Scr:  0.71, 11/30/2018 Age: 86 y.o. Weight: 69.4 kg   Prescription refill sent.

## 2019-10-12 ENCOUNTER — Telehealth (INDEPENDENT_AMBULATORY_CARE_PROVIDER_SITE_OTHER): Payer: Medicare Other | Admitting: Physician Assistant

## 2019-10-12 ENCOUNTER — Encounter: Payer: Self-pay | Admitting: Physician Assistant

## 2019-10-12 VITALS — Ht 61.0 in | Wt 150.0 lb

## 2019-10-12 DIAGNOSIS — F419 Anxiety disorder, unspecified: Secondary | ICD-10-CM | POA: Diagnosis not present

## 2019-10-12 DIAGNOSIS — F329 Major depressive disorder, single episode, unspecified: Secondary | ICD-10-CM | POA: Diagnosis not present

## 2019-10-12 DIAGNOSIS — F32A Depression, unspecified: Secondary | ICD-10-CM

## 2019-10-12 MED ORDER — ALPRAZOLAM 0.25 MG PO TABS: 0.2500 mg | ORAL_TABLET | Freq: Two times a day (BID) | ORAL | 1 refills | Status: DC | PRN

## 2019-10-12 MED ORDER — ESCITALOPRAM OXALATE 10 MG PO TABS: 10.0000 mg | ORAL_TABLET | Freq: Every day | ORAL | 2 refills | Status: DC

## 2019-10-12 NOTE — Progress Notes (Signed)
Virtual Visit via Video   I connected with Mariah Bryant on 10/12/19 at 12:00 PM EDT by a video enabled telemedicine application and verified that I am speaking with the correct person using two identifiers. Location patient: Home Location provider: [facility], Office Persons participating in the virtual visit: Mariah Bryant, Mariah Coke PA-  I discussed the limitations of evaluation and management by telemedicine and the availability of in person appointments. The patient expressed understanding and agreed to proceed.  Subjective:   HPI:   Anxiety Has been on Lexapro for several years. Eventually was weaned down to 10 mg lexapro daily for two years. She decided a few months ago that she wanted to see how she could do with stopping medication. She weaned herself off of the medication with instructions she found on online. She did ok the first few days without this medication, however after about a week she started to notice worsening mood, insomnia and significant crying spells. After a few weeks of being off of Lexapro, she restarted her Lexapro at 5 mg. She was on this for about 2 weeks and then has been on Lexapro 10 mg daily for about a week now. Symptoms are persistent and she wonders what to do now for her mood.  Denies SI/HI.  Has used xanax prn for sleep with good results.  GAD 7 : Generalized Anxiety Score 10/12/2019  Nervous, Anxious, on Edge 3  Control/stop worrying 3  Worry too much - different things 3  Trouble relaxing 3  Restless 2  Easily annoyed or irritable 0  Afraid - awful might happen 3  Total GAD 7 Score 17  Anxiety Difficulty Very difficult      ROS: See pertinent positives and negatives per HPI.  Patient Active Problem List   Diagnosis Date Noted  . RLS (restless legs syndrome) 08/27/2017  . Chronic toe pain, right third toe 08/27/2017  . 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  Macular degeneration of both eyes 08/27/2017  . Difficulty walking 08/27/2017  . Hearing loss, with bilateral hearing aids obtained from [facility] 08/19/2017  . Osteoporosis, on Prolia 08/16/2017  . Chronic pain of left knee 08/16/2017  . Chronic left-sided low back pain without sciatica 08/16/2017  . Macular degeneration 08/16/2017  . Orthostatic intolerance 08/16/2017  . Aortic atherosclerosis ([place]) 08/16/2017  . Degenerative arthritis of lumbar spine 08/16/2017  . Allergic rhinitis, on Flonase prn 08/15/2017  . PAF (paroxysmal atrial fibrillation) ([place]) 05/05/2017  . Status post total right knee replacement 09/17/2015  . Atrial paroxysmal tachycardia ([place]) 12/06/2012  . Brown's syndrome, with double vision 10/07/2010  . Hiatal hernia, without GERD 10/07/2010  . Mitral valve prolapse 10/07/2010  . Seborrheic dermatitis, controlled with Al[place] 10/07/2010  . Temporomandibular joint disorder, right side 10/07/2010  . Mild persistent asthma, on daily Pulmicort 07/17/2009  . Depressive disorder, on Lexapro 07/17/2009  . Hypercholesterolemia, no statin 07/17/2009  . Osteoarthritis 07/17/2009    Social History   Tobacco Use  . Smoking status: Never Smoker  . Smokeless tobacco: Never Used  Substance Use Topics  . Alcohol use: Yes    Alcohol/week: 1.0 standard drink    Types: 1 Glasses of wine per week    Comment: occasionally    Current Outpatient Medications:  .  albuterol (PROVENTIL HFA;VENTOLIN HFA) 108 (90 Base) MCG/ACT inhaler, Inhale 2 puffs into the lungs every 4 (four) hours as needed for wheezing or shortness of breath., Disp: ,  Rfl:  .  ALPRAZolam (XANAX) 0.25 MG tablet, Take 1 tablet (0.25 mg total) by mouth 2 (two) times daily as needed for anxiety or sleep., Disp: 30 tablet, Rfl: 1 .  budesonide (PULMICORT FLEXHALER) 180 MCG/ACT inhaler, Take 2 puffs by mouth 2 (two) times daily as needed., Disp: , Rfl:  .  denosumab (PROLIA) 60 MG/ML SOSY injection, Inject 60 mg into the  skin every 6 (six) months., Disp: , Rfl:  .  ELIQUIS 5 MG TABS tablet, TAKE 1 TABLET BY MOUTH TWICE A DAY, Disp: 60 tablet, Rfl: 5 .  escitalopram (LEXAPRO) 10 MG tablet, Take 1 tablet (10 mg total) by mouth daily., Disp: 90 tablet, Rfl: 2 .  fluticasone (FLONASE) 50 MCG/ACT nasal spray, Place 2 sprays into both nostrils 2 (two) times daily as needed for allergies or rhinitis., Disp: , Rfl:  .  Multiple Vitamins-Minerals (PRESERVISION AREDS 2 PO), Take 1 tablet by mouth 2 (two) times daily., Disp: , Rfl:   Allergies  Allergen Reactions  . Amoxicillin Rash  . Epinephrine Palpitations  . Shellfish Allergy Anaphylaxis  . Morphine And Related Other (See Comments)    Irregular heart beat  . Atarax [Hydroxyzine] Anxiety  . Biaxin [Clarithromycin] Rash  . Codeine Palpitations  . Doxycycline Rash  . Novocain [Procaine] Palpitations  . Penicillins Rash    Objective:   VITALS: Per patient if applicable, see vitals. GENERAL: Alert, appears well and in no acute distress. Tearful at times. HEENT: Atraumatic, conjunctiva clear, no obvious abnormalities on inspection of external nose and ears. NECK: Normal movements of the head and neck. CARDIOPULMONARY: No increased WOB. Speaking in clear sentences. I:E ratio WNL.  MS: Moves all visible extremities without noticeable abnormality. PSYCH: Pleasant and cooperative, well-groomed. Speech normal rate and rhythm. Affect is appropriate. Insight and judgement are appropriate. Attention is focused, linear, and appropriate.  NEURO: CN grossly intact. Oriented as arrived to appointment on time with no prompting. Moves both UE equally.  SKIN: No obvious lesions, wounds, erythema, or cyanosis noted on face or hands.  Assessment and Plan:   [name] was seen today for anxiety.  Diagnoses and all orders for this visit:  Depressive disorder, on Lexapro Uncontrolled. Continue Lexapro 10 mg daily. Discussed it takes several weeks for this medication to fully be  in her system and try to be patient. Refill xanax for prn use up to two times daily -- reviewed medication with patient and daughter. Risks of medication discussed. Follow-up in 1 month, sooner if concerns. Will refer for therapy per patient request. I discussed with patient that if they develop any SI, to tell someone immediately and seek medical attention. -     escitalopram (LEXAPRO) 10 MG tablet; Take 1 tablet (10 mg total) by mouth daily.  Other orders -     ALPRAZolam (XANAX) 0.25 MG tablet; Take 1 tablet (0.25 mg total) by mouth 2 (two) times daily as needed for anxiety or sleep.    . Reviewed expectations re: course of current medical issues. . Discussed self-management of symptoms. . Outlined signs and symptoms indicating need for more acute intervention. . Patient verbalized understanding and all questions were answered. [name] Health Maintenance issues including appropriate healthy diet, exercise, and smoking avoidance were discussed with patient. . See orders for this visit as documented in the electronic medical record.  I discussed the assessment and treatment plan with the patient. The patient was provided an opportunity to ask questions and all were answered. The patient agreed with the  plan and demonstrated an understanding of the instructions.   The patient was advised to call back or seek an in-person evaluation if the symptoms worsen or if the condition fails to improve as anticipated.   CMA or LPN served as scribe during this visit. History, Physical, and Plan performed by medical provider. The above documentation has been reviewed and is accurate and complete.   [place], [place] 10/12/2019

## 2019-10-14 ENCOUNTER — Other Ambulatory Visit: Payer: Self-pay | Admitting: Family Medicine

## 2019-10-14 DIAGNOSIS — F329 Major depressive disorder, single episode, unspecified: Secondary | ICD-10-CM

## 2019-10-14 DIAGNOSIS — F32A Depression, unspecified: Secondary | ICD-10-CM

## 2019-10-16 ENCOUNTER — Ambulatory Visit: Payer: Medicare Other | Admitting: Physician Assistant

## 2019-10-17 ENCOUNTER — Ambulatory Visit: Payer: Medicare Other | Admitting: Physician Assistant

## 2019-10-17 ENCOUNTER — Encounter: Payer: Self-pay | Admitting: Physician Assistant

## 2019-10-25 NOTE — Telephone Encounter (Signed)
Benjie Karvonen can you look into this. Thanks

## 2019-10-25 NOTE — Telephone Encounter (Signed)
Mariah Bryant yes there is it was placed on 8/13.

## 2019-10-26 ENCOUNTER — Telehealth: Payer: Self-pay | Admitting: *Deleted

## 2019-10-26 ENCOUNTER — Other Ambulatory Visit: Payer: Self-pay | Admitting: *Deleted

## 2019-10-26 DIAGNOSIS — F32A Depression, unspecified: Secondary | ICD-10-CM

## 2019-10-26 DIAGNOSIS — F419 Anxiety disorder, unspecified: Secondary | ICD-10-CM

## 2019-10-26 NOTE — Telephone Encounter (Signed)
Left message for pt and pt's daughter to call office.

## 2019-10-31 NOTE — Telephone Encounter (Signed)
Returned call

## 2019-10-31 NOTE — Telephone Encounter (Signed)
Left detailed message on personal voicemail, Behavioral health has tried to contact you to schedule. Here is there number [phone] please call and schedule appt. Any questions please call the office.

## 2019-11-26 ENCOUNTER — Encounter: Payer: Self-pay | Admitting: Physician Assistant

## 2019-11-26 ENCOUNTER — Other Ambulatory Visit: Payer: Self-pay

## 2019-11-26 ENCOUNTER — Ambulatory Visit (INDEPENDENT_AMBULATORY_CARE_PROVIDER_SITE_OTHER): Payer: Medicare Other | Admitting: Physician Assistant

## 2019-11-26 VITALS — BP 110/60 | HR 86 | Temp 98.4°F | Ht 61.0 in | Wt 154.0 lb

## 2019-11-26 DIAGNOSIS — Z23 Encounter for immunization: Secondary | ICD-10-CM

## 2019-11-26 DIAGNOSIS — M810 Age-related osteoporosis without current pathological fracture: Secondary | ICD-10-CM

## 2019-11-26 DIAGNOSIS — M67442 Ganglion, left hand: Secondary | ICD-10-CM | POA: Diagnosis not present

## 2019-11-26 DIAGNOSIS — Z20822 Contact with and (suspected) exposure to covid-19: Secondary | ICD-10-CM

## 2019-11-26 MED ORDER — DENOSUMAB 60 MG/ML ~~LOC~~ SOSY
60.0000 mg | PREFILLED_SYRINGE | Freq: Once | SUBCUTANEOUS | Status: AC
  Administered 2019-11-26: 60 mg via SUBCUTANEOUS

## 2019-11-26 NOTE — Progress Notes (Signed)
Mariah Bryant is a 86 y.o. female here for a follow up of a pre-existing problem.  I acted as a Education administrator for [employer], PA-C Anselmo Pickler, LPN   History of Present Illness:   Chief Complaint  Patient presents with  . fluid on knuckle    HPI   Fluid on knuckle Pt is c/o fluid on knuckle of left ring finger x 2-3 days. Having trouble bending finger. Pt says this has happened before at least twice and required drainage from a hand surgeon. She thinks that she may need drainage at this time.  COVID antibody testing She received Moderna vaccines at beginning of this year. She would like testing for her antibodies. Denies current symptoms/concerns.    Past Medical History:  Diagnosis Date  . Allergic rhinitis 08/15/2017  . Asthma, stable, moderate persistent 07/17/2009  . Atrial fibrillation ([place])   . 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 Chronic pain of left knee 08/16/2017  . Depressive disorder 07/17/2009  . Gastro-esophageal reflux disease without esophagitis 10/07/2010  . Hiatal hernia 10/07/2010  . Hypercholesterolemia 07/17/2009  . Hypogammaglobulinemia ([place]) 08/15/2017  . Macular degeneration 08/16/2017  . Orthostatic intolerance 08/16/2017  . Osteoarthritis 07/17/2009  . Osteoporosis 08/16/2017  . Seborrheic dermatitis 10/07/2010  . Sleep apnea 07/17/2009  . Temporomandibular joint disorder 10/07/2010     Social History   Tobacco Use  . Smoking status: Never Smoker  . Smokeless tobacco: Never Used  Vaping Use  . Vaping Use: Never used  Substance Use Topics  . Alcohol use: Yes    Alcohol/week: 1.0 standard drink    Types: 1 Glasses of wine per week    Comment: occasionally  . Drug use: Never    Past Surgical History:  Procedure Laterality Date  . APPENDECTOMY    . CATARACT EXTRACTION, BILATERAL    . CHOLECYSTECTOMY    . HYSTERECTOMY ABDOMINAL WITH SALPINGO-OOPHORECTOMY    . REPLACEMENT TOTAL KNEE      Family History  Problem Relation Age of Onset  . Stroke Mother    . Cancer Sister   . Cancer Brother     Allergies  Allergen Reactions  . Amoxicillin Rash  . Epinephrine Palpitations  . Shellfish Allergy Anaphylaxis  . Morphine And Related Other (See Comments)    Irregular heart beat  . Atarax [Hydroxyzine] Anxiety  . Biaxin [Clarithromycin] Rash  . Codeine Palpitations  . Doxycycline Rash  . Novocain [Procaine] Palpitations  . Penicillins Rash    Current Medications:   Current Outpatient Medications:  .  albuterol (PROVENTIL HFA;VENTOLIN HFA) 108 (90 Base) MCG/ACT inhaler, Inhale 2 puffs into the lungs every 4 (four) hours as needed for wheezing or shortness of breath., Disp: , Rfl:  .  ALPRAZolam (XANAX) 0.25 MG tablet, Take 1 tablet (0.25 mg total) by mouth 2 (two) times daily as needed for anxiety or sleep., Disp: 30 tablet, Rfl: 1 .  budesonide (PULMICORT FLEXHALER) 180 MCG/ACT inhaler, Take 2 puffs by mouth 2 (two) times daily as needed., Disp: , Rfl:  .  denosumab (PROLIA) 60 MG/ML SOSY injection, Inject 60 mg into the skin every 6 (six) months., Disp: , Rfl:  .  ELIQUIS 5 MG TABS tablet, TAKE 1 TABLET BY MOUTH TWICE A DAY, Disp: 60 tablet, Rfl: 5 .  escitalopram (LEXAPRO) 10 MG tablet, Take 1 tablet (10 mg total) by mouth daily. (Patient taking differently: Take 15 mg by mouth daily. ), Disp: 90 tablet, Rfl: 2 .  fluticasone (FLONASE) 50 MCG/ACT nasal spray, Place  2 sprays into both nostrils 2 (two) times daily as needed for allergies or rhinitis., Disp: , Rfl:  .  Multiple Vitamins-Minerals (PRESERVISION AREDS 2 PO), Take 1 tablet by mouth 2 (two) times daily., Disp: , Rfl:    Review of Systems:   ROS  Negative unless otherwise specified per HPI.  Vitals:   Vitals:   11/26/19 1137  BP: 110/60  Pulse: 86  Temp: 98.4 F (36.9 C)  TempSrc: Temporal  SpO2: 96%  Weight: 154 lb (69.9 kg)  Height: 5\' 1"  (1.549 m)     Body mass index is 29.1 kg/m.  Physical Exam:   Physical Exam Constitutional:      Appearance: She is  well-developed.  HENT:     Head: Normocephalic and atraumatic.  Eyes:     Conjunctiva/sclera: Conjunctivae normal.  Pulmonary:     Effort: Pulmonary effort is normal.  Musculoskeletal:        General: Normal range of motion.     Cervical back: Normal range of motion and neck supple.     Comments: PIP of L finger joint with significant localized swelling, minimal fluctuance; mild erythema; no warmth  Skin:    General: Skin is warm and dry.  Neurological:     Mental Status: She is alert and oriented to person, place, and time.  Psychiatric:        Behavior: Behavior normal.        Thought Content: Thought content normal.        Judgment: Judgment normal.      Assessment and Plan:   [name] was seen today for fluid on knuckle.  Diagnoses and all orders for this visit:  Encounter for screening laboratory testing for COVID-19 virus in asymptomatic patient Update today per patient request. Recommend booster when available for Moderna. Continue strict covid precautions. -     SAR CoV2 Serology (COVID 19)AB(IGG)IA  Osteoporosis without current pathological fracture, unspecified osteoporosis type -     denosumab (PROLIA) injection 60 mg  Need for immunization against influenza -     Flu Vaccine QUAD High Dose(Fluad)  Ganglion cyst of finger of left hand Referral to [facility] for evaluation. -     Ambulatory referral to [place] or LPN served as scribe during this visit. History, Physical, and Plan performed by medical provider. The above documentation has been reviewed and is accurate and complete.  Inda Coke, PA-C

## 2019-11-26 NOTE — Patient Instructions (Signed)
It was great to see you!  Safe travels!  You will be contacted about your referral to Dr. Georgina Snell (sports medicine doctor.)  You cannot get the booster until they say the [facility] booster is available, currently it is only available to those who received [place].  I will be in touch with your lab result.  If you were to get COVID, I recommend that you seek out a COVID-19 antibody infusion center for administration of the monoclonal antibody infusion to help reduce severity symptoms and course.  Take care,  Inda Coke PA-C

## 2019-11-27 ENCOUNTER — Encounter: Payer: Self-pay | Admitting: Physician Assistant

## 2019-11-27 LAB — SARS-COV-2 ANTIBODY(IGG)SPIKE,SEMI-QUANTITATIVE: SARS COV1 AB(IGG)SPIKE,SEMI QN: 9.06 index — ABNORMAL HIGH (ref <1.00)

## 2019-11-29 ENCOUNTER — Encounter: Payer: Self-pay | Admitting: Family Medicine

## 2019-11-29 ENCOUNTER — Ambulatory Visit: Payer: Self-pay

## 2019-11-29 ENCOUNTER — Ambulatory Visit (INDEPENDENT_AMBULATORY_CARE_PROVIDER_SITE_OTHER): Payer: Medicare Other | Admitting: Family Medicine

## 2019-11-29 ENCOUNTER — Ambulatory Visit (INDEPENDENT_AMBULATORY_CARE_PROVIDER_SITE_OTHER): Payer: Medicare Other

## 2019-11-29 ENCOUNTER — Other Ambulatory Visit: Payer: Self-pay

## 2019-11-29 VITALS — BP 100/62 | HR 84 | Ht 61.0 in | Wt 153.4 lb

## 2019-11-29 DIAGNOSIS — M069 Rheumatoid arthritis, unspecified: Secondary | ICD-10-CM

## 2019-11-29 DIAGNOSIS — M67442 Ganglion, left hand: Secondary | ICD-10-CM

## 2019-11-29 DIAGNOSIS — M79642 Pain in left hand: Secondary | ICD-10-CM

## 2019-11-29 DIAGNOSIS — M1812 Unilateral primary osteoarthritis of first carpometacarpal joint, left hand: Secondary | ICD-10-CM | POA: Diagnosis not present

## 2019-11-29 NOTE — Progress Notes (Signed)
Subjective:    CC: L ring finger swelling at PIP joint  I, Wendy Poet, LAT, ATC, am serving as scribe for Dr. Lynne Leader.  HPI: Pt is an 86 y/o female presenting w/ c/o L ring finger swelling for the last 3 years that has worsened over the last month.  She specifically locates her issue to her L ring finger PIP joint.  She has a history of ganglion cysts in her hands/on her fingers and has gotten them drained 2x with the most recent being summer 2021.  She thinks that 20 years ago she was told that she has rheumatoid arthritis but was told to not take any medications for it.  She notes her hands are stiff and a little painful but overall she feels pretty well.  Aggravating factors: L finger flexion when it's particularly swollen Treatments tried: cyst has been drained 2x previously  Pertinent review of Systems: No fevers or chills  Relevant historical information: Atrial Fibrillation on Eliquis..  Osteoporosis., Glaucoma   Objective:    Vitals:   11/29/19 1122  BP: 100/62  Pulse: 84  SpO2: 96%   General: Well Developed, well nourished, and in no acute distress.   MSK: Hands bilaterally with MCP synovitis and deviation at DIP.  Left hand soft nodule overlying fourth PIP.  Decreased hand motion to flexion and full extension.  Lab and Radiology Results  Xray hand left images obtained today personally and independently interpreted Diffuse DJD changes throughout hand worse at third MCP, second third and fifth DIP and first [place] no fractures. Await formal radiology review  Diagnostic Limited MSK Ultrasound of: Left fourth digit dorsal Cystic fluid-filled structure dorsal to the IP joint. Impression: Ganglion cyst  Aspiration and injection left 4th digit PIP ganglion cyst Consent obtained and timeout performed Skin cleaned with isopropyl alcohol. Ethyl chloride cold spray applied. Lidocaine injected at radial digital nerve and superficial to ganglion cyst achieving good  anesthesia. Skin sterilized with isopropyl alcohol again 18-gauge needle was used to access the ganglion cyst.  Fluid was not able to be aspirated using suction with syringe. Needle was removed and the gelatinous ganglion cyst material was expressed from the puncture wound manually. Skin was again sterilized and 25-gauge needle was used to access the cyst structure and joint space deep to the cyst and 40 mg of Depo-Medrol and 0.5 mL of lidocaine were injected. Dressing was applied.  Impression and Recommendations:    Assessment and Plan: 86 y.o. female with left finger ganglion cyst.  Patient has had aspiration attempts in the past without steroid use.  We will aspirate cyst and inject with steroid today.  If this should return reasonable to discuss with orthopedic hand surgery surgical excision and removal.  However patient has what appears to be rheumatoid arthritis and is not really treated at all for it.  We will proceed with rheumatologic work-up listed below if positive refer to rheumatology.  PDMP not reviewed this encounter. Orders Placed This Encounter  Procedures  . [place] LIMITED JOINT SPACE STRUCTURES UP LEFT(NO LINKED CHARGES)    Standing Status:   Future    Number of Occurrences:   1    Standing Expiration Date:   11/28/2020    Order Specific Question:   Reason for Exam (SYMPTOM  OR DIAGNOSIS REQUIRED)    Answer:   Left hand pain    Order Specific Question:   Preferred imaging location?    Answer:   [facility]  .  DG Hand Complete Left    Standing Status:   Future    Number of Occurrences:   1    Standing Expiration Date:   11/28/2020    Order Specific Question:   Reason for Exam (SYMPTOM  OR DIAGNOSIS REQUIRED)    Answer:   eval ra    Order Specific Question:   Preferred imaging location?    Answer:   [facility]  . CBC with Differential/Platelet    Standing Status:   Future    Number of Occurrences:   1    Standing Expiration Date:    12/29/2019  . ANA    Standing Status:   Future    Number of Occurrences:   1    Standing Expiration Date:   11/28/2020  . Sedimentation rate    Standing Status:   Future    Number of Occurrences:   1    Standing Expiration Date:   11/28/2020  . Rheumatoid factor    Standing Status:   Future    Number of Occurrences:   1    Standing Expiration Date:   11/28/2020  . Cyclic citrul peptide antibody, IgG    Standing Status:   Future    Number of Occurrences:   1    Standing Expiration Date:   11/28/2020  . LUPUS(12) PANEL    Standing Status:   Future    Number of Occurrences:   1    Standing Expiration Date:   11/28/2020  . COMPLETE METABOLIC PANEL WITH GFR    Standing Status:   Future    Number of Occurrences:   1    Standing Expiration Date:   11/28/2020   No orders of the defined types were placed in this encounter.   Discussed warning signs or symptoms. Please see discharge instructions. Patient expresses understanding.   The above documentation has been reviewed and is accurate and complete Lynne Leader, M.D.

## 2019-11-29 NOTE — Patient Instructions (Signed)
Thank you for coming in today.  Please get an Xray today before you leave  Please get labs today before you leave  Please use voltaren gel up to 4x daily for pain as needed.   If labs show RA I will refer to rheumatology.   If the ganglion cyst comes back I will refer to hand surgery.   Call or go to the ER if you develop a large red swollen joint with extreme pain or oozing puss.

## 2019-12-03 NOTE — Progress Notes (Signed)
X-ray left hand shows osteoarthritis but not changes consistent with rheumatoid arthritis usually.

## 2019-12-05 DIAGNOSIS — H353221 Exudative age-related macular degeneration, left eye, with active choroidal neovascularization: Secondary | ICD-10-CM | POA: Diagnosis not present

## 2019-12-09 LAB — COMPLETE METABOLIC PANEL WITH GFR
AG Ratio: 1.8 (calc) (ref 1.0–2.5)
ALT: 18 U/L (ref 6–29)
AST: 21 U/L (ref 10–35)
Albumin: 4.2 g/dL (ref 3.6–5.1)
Alkaline phosphatase (APISO): 90 U/L (ref 37–153)
BUN: 10 mg/dL (ref 7–25)
CO2: 27 mmol/L (ref 20–32)
Calcium: 8.9 mg/dL (ref 8.6–10.4)
Chloride: 101 mmol/L (ref 98–110)
Creat: 0.73 mg/dL (ref 0.60–0.88)
GFR, Est African American: 86 mL/min/{1.73_m2} (ref >=60)
GFR, Est Non African American: 74 mL/min/{1.73_m2} (ref >=60)
Globulin: 2.3 g/dL (calc) (ref 1.9–3.7)
Glucose, Bld: 79 mg/dL (ref 65–99)
Potassium: 4.8 mmol/L (ref 3.5–5.3)
Sodium: 139 mmol/L (ref 135–146)
Total Bilirubin: 0.5 mg/dL (ref 0.2–1.2)
Total Protein: 6.5 g/dL (ref 6.1–8.1)

## 2019-12-09 LAB — CBC WITH DIFFERENTIAL/PLATELET
Absolute Monocytes: 462 cells/uL (ref 200–950)
Basophils Absolute: 73 cells/uL (ref 0–200)
Basophils Relative: 1.1 %
Eosinophils Absolute: 73 cells/uL (ref 15–500)
Eosinophils Relative: 1.1 %
HCT: 48.5 % — ABNORMAL HIGH (ref 35.0–45.0)
Hemoglobin: 15.7 g/dL — ABNORMAL HIGH (ref 11.7–15.5)
Lymphs Abs: 1881 cells/uL (ref 850–3900)
MCH: 28.8 pg (ref 27.0–33.0)
MCHC: 32.4 g/dL (ref 32.0–36.0)
MCV: 88.8 fL (ref 80.0–100.0)
MPV: 11.2 fL (ref 7.5–12.5)
Monocytes Relative: 7 %
Neutro Abs: 4112 cells/uL (ref 1500–7800)
Neutrophils Relative %: 62.3 %
Platelets: 274 10*3/uL (ref 140–400)
RBC: 5.46 10*6/uL — ABNORMAL HIGH (ref 3.80–5.10)
RDW: 12.7 % (ref 11.0–15.0)
Total Lymphocyte: 28.5 %
WBC: 6.6 10*3/uL (ref 3.8–10.8)

## 2019-12-09 LAB — SEDIMENTATION RATE: Sed Rate: 2 mm/h (ref 0–30)

## 2019-12-09 LAB — ANTI-NUCLEAR AB-TITER (ANA TITER): ANA Titer 1: 1:320 {titer} — ABNORMAL HIGH

## 2019-12-09 LAB — LUPUS(12) PANEL
Anti Nuclear Antibody (ANA): POSITIVE — AB
C3 Complement: 131 mg/dL
C4 Complement: 36 mg/dL
ENA SM Ab Ser-aCnc: <1 AI
Rheumatoid fact SerPl-aCnc: <14 IU/mL (ref <14)
Ribosomal P Protein Ab: <1 AI
SM/RNP: <1 AI
SSA (Ro) (ENA) Antibody, IgG: <1 AI
SSB (La) (ENA) Antibody, IgG: <1 AI
Scleroderma (Scl-70) (ENA) Antibody, IgG: <1 AI
Thyroperoxidase Ab SerPl-aCnc: 2 IU/mL (ref <9)
ds DNA Ab: <1 IU/mL

## 2019-12-09 LAB — CYCLIC CITRUL PEPTIDE ANTIBODY, IGG: Cyclic Citrullin Peptide Ab: <16 UNITS

## 2019-12-11 ENCOUNTER — Telehealth: Payer: Self-pay | Admitting: Family Medicine

## 2019-12-11 DIAGNOSIS — R768 Other specified abnormal immunological findings in serum: Secondary | ICD-10-CM

## 2019-12-11 DIAGNOSIS — M67442 Ganglion, left hand: Secondary | ICD-10-CM

## 2019-12-11 DIAGNOSIS — M79642 Pain in left hand: Secondary | ICD-10-CM

## 2019-12-11 NOTE — Telephone Encounter (Signed)
Sent referral to internal [facility] rheumatology

## 2019-12-11 NOTE — Progress Notes (Signed)
We are still waiting on 1 lab to come back but the ANA lab which is a marker of potential lupus came back very elevated.  Based on this I do think it is reasonable to follow-up with a rheumatologist.  I have placed a referral.

## 2019-12-11 NOTE — Telephone Encounter (Signed)
ANA significantly elevated with abnormal titer. Will refer to rheumatology.

## 2019-12-16 DIAGNOSIS — F419 Anxiety disorder, unspecified: Secondary | ICD-10-CM | POA: Diagnosis not present

## 2019-12-16 DIAGNOSIS — K219 Gastro-esophageal reflux disease without esophagitis: Secondary | ICD-10-CM | POA: Diagnosis not present

## 2019-12-16 DIAGNOSIS — I5033 Acute on chronic diastolic (congestive) heart failure: Secondary | ICD-10-CM | POA: Diagnosis not present

## 2019-12-16 DIAGNOSIS — E785 Hyperlipidemia, unspecified: Secondary | ICD-10-CM | POA: Diagnosis not present

## 2019-12-16 DIAGNOSIS — I509 Heart failure, unspecified: Secondary | ICD-10-CM | POA: Diagnosis not present

## 2019-12-16 DIAGNOSIS — J189 Pneumonia, unspecified organism: Secondary | ICD-10-CM | POA: Diagnosis not present

## 2019-12-16 DIAGNOSIS — Z03818 Encounter for observation for suspected exposure to other biological agents ruled out: Secondary | ICD-10-CM | POA: Diagnosis not present

## 2019-12-16 DIAGNOSIS — I4891 Unspecified atrial fibrillation: Secondary | ICD-10-CM | POA: Diagnosis not present

## 2019-12-16 DIAGNOSIS — I480 Paroxysmal atrial fibrillation: Secondary | ICD-10-CM | POA: Diagnosis not present

## 2019-12-17 DIAGNOSIS — I499 Cardiac arrhythmia, unspecified: Secondary | ICD-10-CM | POA: Diagnosis not present

## 2019-12-17 DIAGNOSIS — R8281 Pyuria: Secondary | ICD-10-CM | POA: Diagnosis present

## 2019-12-17 DIAGNOSIS — K219 Gastro-esophageal reflux disease without esophagitis: Secondary | ICD-10-CM | POA: Diagnosis present

## 2019-12-17 DIAGNOSIS — I495 Sick sinus syndrome: Secondary | ICD-10-CM | POA: Diagnosis present

## 2019-12-17 DIAGNOSIS — Z9049 Acquired absence of other specified parts of digestive tract: Secondary | ICD-10-CM | POA: Diagnosis not present

## 2019-12-17 DIAGNOSIS — J4530 Mild persistent asthma, uncomplicated: Secondary | ICD-10-CM | POA: Diagnosis present

## 2019-12-17 DIAGNOSIS — Z20822 Contact with and (suspected) exposure to covid-19: Secondary | ICD-10-CM | POA: Diagnosis present

## 2019-12-17 DIAGNOSIS — I480 Paroxysmal atrial fibrillation: Secondary | ICD-10-CM | POA: Diagnosis present

## 2019-12-17 DIAGNOSIS — I5033 Acute on chronic diastolic (congestive) heart failure: Secondary | ICD-10-CM | POA: Diagnosis present

## 2019-12-17 DIAGNOSIS — G252 Other specified forms of tremor: Secondary | ICD-10-CM | POA: Diagnosis present

## 2019-12-17 DIAGNOSIS — I4891 Unspecified atrial fibrillation: Secondary | ICD-10-CM | POA: Diagnosis not present

## 2019-12-17 DIAGNOSIS — E785 Hyperlipidemia, unspecified: Secondary | ICD-10-CM | POA: Diagnosis present

## 2019-12-17 DIAGNOSIS — I951 Orthostatic hypotension: Secondary | ICD-10-CM | POA: Diagnosis present

## 2019-12-17 DIAGNOSIS — I5032 Chronic diastolic (congestive) heart failure: Secondary | ICD-10-CM | POA: Insufficient documentation

## 2019-12-17 DIAGNOSIS — R0600 Dyspnea, unspecified: Secondary | ICD-10-CM | POA: Diagnosis not present

## 2019-12-17 DIAGNOSIS — I340 Nonrheumatic mitral (valve) insufficiency: Secondary | ICD-10-CM | POA: Diagnosis present

## 2019-12-17 DIAGNOSIS — Z7951 Long term (current) use of inhaled steroids: Secondary | ICD-10-CM | POA: Diagnosis not present

## 2019-12-17 DIAGNOSIS — Z5181 Encounter for therapeutic drug level monitoring: Secondary | ICD-10-CM | POA: Diagnosis not present

## 2019-12-17 DIAGNOSIS — Z79899 Other long term (current) drug therapy: Secondary | ICD-10-CM | POA: Diagnosis not present

## 2019-12-17 DIAGNOSIS — J189 Pneumonia, unspecified organism: Secondary | ICD-10-CM | POA: Diagnosis present

## 2019-12-17 DIAGNOSIS — R9431 Abnormal electrocardiogram [ECG] [EKG]: Secondary | ICD-10-CM | POA: Diagnosis not present

## 2019-12-17 DIAGNOSIS — Z6831 Body mass index (BMI) 31.0-31.9, adult: Secondary | ICD-10-CM | POA: Diagnosis not present

## 2019-12-17 DIAGNOSIS — J4520 Mild intermittent asthma, uncomplicated: Secondary | ICD-10-CM | POA: Diagnosis not present

## 2019-12-17 DIAGNOSIS — E876 Hypokalemia: Secondary | ICD-10-CM | POA: Diagnosis not present

## 2019-12-17 DIAGNOSIS — E669 Obesity, unspecified: Secondary | ICD-10-CM | POA: Diagnosis not present

## 2019-12-17 DIAGNOSIS — F419 Anxiety disorder, unspecified: Secondary | ICD-10-CM | POA: Diagnosis present

## 2019-12-17 DIAGNOSIS — R918 Other nonspecific abnormal finding of lung field: Secondary | ICD-10-CM | POA: Diagnosis not present

## 2019-12-17 DIAGNOSIS — Z7901 Long term (current) use of anticoagulants: Secondary | ICD-10-CM | POA: Diagnosis not present

## 2019-12-17 DIAGNOSIS — I509 Heart failure, unspecified: Secondary | ICD-10-CM | POA: Diagnosis not present

## 2019-12-17 DIAGNOSIS — F39 Unspecified mood [affective] disorder: Secondary | ICD-10-CM | POA: Diagnosis present

## 2019-12-19 ENCOUNTER — Telehealth: Payer: Self-pay | Admitting: Internal Medicine

## 2019-12-19 DIAGNOSIS — E876 Hypokalemia: Secondary | ICD-10-CM | POA: Insufficient documentation

## 2019-12-19 NOTE — Telephone Encounter (Signed)
Pt c/o medication issue:  1. Name of Medication: Amiodarone  2. How are you currently taking this medication (dosage and times per day)? Per daughter, mother is at the hospital and she has been put on this medication. Unsure of dosage and how times per day.   3. Are you having a reaction (difficulty breathing--STAT)? Unsure.   4. What is your medication issue? Per daughter, mother is in a hospital [place]. She states that this hospital is known for not being very good. She says that they have her taking amiodarone contrary to what Dr. Caryl Comes suggested her be on. She is seeking advice on what to do. Aware Dr. Caryl Comes is out this week.

## 2019-12-19 NOTE — Telephone Encounter (Signed)
I spoke with patient's daughter. She reports patient is currently hospitalized in [place], [place]. Admitted with atrial fib, swelling and shortness of breath.  Started on amiodarone and lasix.  Daughter is concerned about amiodarone being a strong medication for patient.  I asked daughter to discuss her concerns with team treating patient in the hospital.  Daughter will contact our office to schedule appointment once it is determined when she will be back in [place]. Daughter is asking if there is a cardiologist in the [place] area Dr Caryl Comes would recommend.

## 2019-12-24 DIAGNOSIS — I480 Paroxysmal atrial fibrillation: Secondary | ICD-10-CM | POA: Diagnosis not present

## 2019-12-24 DIAGNOSIS — B372 Candidiasis of skin and nail: Secondary | ICD-10-CM | POA: Diagnosis not present

## 2019-12-25 NOTE — Telephone Encounter (Signed)
Agree   will be glad to see her when she returns

## 2019-12-26 NOTE — Telephone Encounter (Signed)
Patient has appointment with Tommye Standard, PA on 01/02/20

## 2019-12-28 DIAGNOSIS — J189 Pneumonia, unspecified organism: Secondary | ICD-10-CM | POA: Diagnosis not present

## 2019-12-28 DIAGNOSIS — J4530 Mild persistent asthma, uncomplicated: Secondary | ICD-10-CM | POA: Diagnosis not present

## 2019-12-28 DIAGNOSIS — R918 Other nonspecific abnormal finding of lung field: Secondary | ICD-10-CM | POA: Insufficient documentation

## 2020-01-01 ENCOUNTER — Telehealth: Payer: Self-pay | Admitting: Physician Assistant

## 2020-01-01 NOTE — Telephone Encounter (Signed)
Pt daughter will call back schedule Medicare Annual Wellness Visit (AWV) either virtually/audio only OR in office. Whatever the patients preference is.  No hx; please schedule at anytime with [facility]-Nurse Health Advisor at [facility].  This should be a 45 minute visit.

## 2020-01-01 NOTE — Telephone Encounter (Signed)
Correction; Per [facility], Last AWV was 11/2016.  Pt is due for AWV-S

## 2020-01-02 ENCOUNTER — Ambulatory Visit: Payer: Medicare Other | Admitting: Physician Assistant

## 2020-01-10 ENCOUNTER — Ambulatory Visit (INDEPENDENT_AMBULATORY_CARE_PROVIDER_SITE_OTHER): Payer: Medicare Other | Admitting: Student

## 2020-01-10 ENCOUNTER — Encounter: Payer: Self-pay | Admitting: Student

## 2020-01-10 ENCOUNTER — Other Ambulatory Visit: Payer: Self-pay

## 2020-01-10 VITALS — BP 112/60 | HR 73 | Ht 61.0 in | Wt 147.0 lb

## 2020-01-10 DIAGNOSIS — I951 Orthostatic hypotension: Secondary | ICD-10-CM

## 2020-01-10 DIAGNOSIS — F32A Depression, unspecified: Secondary | ICD-10-CM

## 2020-01-10 DIAGNOSIS — I480 Paroxysmal atrial fibrillation: Secondary | ICD-10-CM

## 2020-01-10 DIAGNOSIS — F419 Anxiety disorder, unspecified: Secondary | ICD-10-CM

## 2020-01-10 DIAGNOSIS — I5032 Chronic diastolic (congestive) heart failure: Secondary | ICD-10-CM | POA: Diagnosis not present

## 2020-01-10 MED ORDER — FUROSEMIDE 20 MG PO TABS
20.0000 mg | ORAL_TABLET | Freq: Every day | ORAL | 3 refills | Status: DC
Start: 2020-01-10 — End: 2021-03-24

## 2020-01-10 MED ORDER — AMIODARONE HCL 200 MG PO TABS
200.0000 mg | ORAL_TABLET | Freq: Every day | ORAL | 3 refills | Status: DC
Start: 2020-01-10 — End: 2020-10-17

## 2020-01-10 NOTE — Progress Notes (Signed)
PCP:  Inda Coke, PA Primary Cardiologist: No primary care provider on file. Electrophysiologist: [name], MD   Mariah Bryant is a 86 y.o. female seen today for [name], MD for post hospital follow up. She was admitted in [place] for AF and acute on chronic diastolic CHF. She was discharged on amiodarone and lasix, and has since been doing much better. Her edema is very well controlled on lasix. She is having mild lightheadedness when going from bending over to standing. Remains on lasix. she denies dyspnea, PND, orthopnea, nausea, vomiting, syncope, edema, weight gain, or early satiety.  Her daughter who is present today states that a Pacemaker was mentioned in [place], but from what they understood it was NOT recommended. There may have been a miscommunication, as they understood there certain medicines to slow her heart that could NOT be used with a PPM.   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   Chronic pain of left knee 08/16/2017   Depressive disorder 07/17/2009   Gastro-esophageal reflux disease without esophagitis 10/07/2010   Hiatal hernia 10/07/2010   Hypercholesterolemia 07/17/2009   Hypogammaglobulinemia ([place]) 08/15/2017   Macular degeneration 08/16/2017   Orthostatic intolerance 08/16/2017   Osteoarthritis 07/17/2009   Osteoporosis 08/16/2017   Seborrheic dermatitis 10/07/2010   Sleep apnea 07/17/2009   Temporomandibular joint disorder 10/07/2010   Past Surgical History:  Procedure Laterality Date   APPENDECTOMY     CATARACT EXTRACTION, BILATERAL     CHOLECYSTECTOMY     HYSTERECTOMY ABDOMINAL WITH SALPINGO-OOPHORECTOMY     REPLACEMENT TOTAL KNEE      Current Outpatient  Medications  Medication Sig Dispense Refill   albuterol (PROVENTIL HFA;VENTOLIN HFA) 108 (90 Base) MCG/ACT inhaler Inhale 2 puffs into the lungs every 4 (four) hours as needed for wheezing or shortness of breath.     ALPRAZolam (XANAX) 0.25 MG tablet Take 1 tablet (0.25 mg total) by mouth 2 (two) times daily as needed for anxiety or sleep. 30 tablet 1   amiodarone (PACERONE) 200 MG tablet Take 200 mg by mouth daily.     budesonide (PULMICORT FLEXHALER) 180 MCG/ACT inhaler Take 2 puffs by mouth 2 (two) times daily as needed.     Cholecalciferol 25 MCG (1000 UT) tablet Take by mouth daily.     clotrimazole (LOTRIMIN) 1 % cream daily.     denosumab (PROLIA) 60 MG/ML SOSY injection Inject 60 mg into the skin every 6 (six) months.     ELIQUIS 5 MG TABS tablet TAKE 1 TABLET BY MOUTH TWICE A DAY 60 tablet 5   escitalopram (LEXAPRO) 10 MG tablet Take 1 tablet (10 mg total) by mouth daily. (Patient taking differently: Take 15 mg by mouth daily. ) 90 tablet 2   fluticasone (FLONASE) 50 MCG/ACT nasal spray Place 2 sprays into both nostrils 2 (two) times daily as needed for allergies or rhinitis.     furosemide (LASIX) 20 MG tablet Take 20 mg by mouth daily.     Multiple Vitamin (MULTIVITAMIN) tablet Take 1 tablet by mouth daily.     Multiple Vitamins-Minerals (PRESERVISION AREDS 2 PO) Take 1 tablet by  mouth 2 (two) times daily.     No current facility-administered medications for this visit.    Allergies  Allergen Reactions   Amoxicillin Rash   Epinephrine Palpitations   Shellfish Allergy Anaphylaxis   Morphine And Related Other (See Comments)    Irregular heart beat   Atarax [Hydroxyzine] Anxiety   Biaxin [Clarithromycin] Rash   Codeine Palpitations   Doxycycline Rash   Novocain [Procaine] Palpitations   Penicillins Rash    Social History   Socioeconomic History   Marital status: Widowed    Spouse name: Not on file   Number of children: Not on file   Years of  education: Not on file   Highest education level: Not on file  Occupational History   Not on file  Tobacco Use   Smoking status: Never Smoker   Smokeless tobacco: Never Used  Vaping Use   Vaping Use: Never used  Substance and Sexual Activity   Alcohol use: Yes    Alcohol/week: 1.0 standard drink    Types: 1 Glasses of wine per week    Comment: occasionally   Drug use: Never   Sexual activity: Not on file  Other Topics Concern   Not on file  Social History Narrative   Lives with daughter and family. Has upstairs suite at the moment. They are working on getting her to the first floor.    Social Determinants of Health   Financial Resource Strain:    Difficulty of Paying Living Expenses: Not on file  Food Insecurity:    Worried About Charity fundraiser in the Last Year: Not on file   [employer] of Food in the Last Year: Not on file  Transportation Needs:    Lack of Transportation (Medical): Not on file   Lack of Transportation (Non-Medical): Not on file  Physical Activity:    Days of Exercise per Week: Not on file   Minutes of Exercise per Session: Not on file  Stress:    Feeling of Stress : Not on file  Social Connections:    Frequency of Communication with Friends and Family: Not on file   Frequency of Social Gatherings with Friends and Family: Not on file   Attends Religious Services: Not on file   Active Member of Clubs or Organizations: Not on file   Attends Archivist Meetings: Not on file   Marital Status: Not on file  Intimate Partner Violence:    Fear of Current or Ex-Partner: Not on file   Emotionally Abused: Not on file   Physically Abused: Not on file   Sexually Abused: Not on file     Review of Systems: General: No chills, fever, night sweats or weight changes  Cardiovascular:  No chest pain, dyspnea on exertion, edema, orthopnea, palpitations, paroxysmal nocturnal dyspnea Dermatological: No rash, lesions or  masses Respiratory: No cough, dyspnea Urologic: No hematuria, dysuria Abdominal: No nausea, vomiting, diarrhea, bright red blood per rectum, melena, or hematemesis Neurologic: No visual changes, weakness, changes in mental status All other systems reviewed and are otherwise negative except as noted above.  Physical Exam: Vitals:   01/10/20 1104  BP: 112/60  Pulse: 73  SpO2: 96%  Weight: 147 lb (66.7 kg)  Height: 5\' 1"  (1.549 m)    GEN- The patient is well appearing, alert and oriented x 3 today.   HEENT: normocephalic, atraumatic; sclera clear, conjunctiva pink; hearing intact; oropharynx clear; neck supple, no JVP Lymph- no cervical lymphadenopathy Lungs- Clear to ausculation bilaterally,  normal work of breathing.  No wheezes, rales, rhonchi Heart- Regular rate and rhythm, no murmurs, rubs or gallops, PMI not laterally displaced GI- soft, non-tender, non-distended, bowel sounds present, no hepatosplenomegaly Extremities- no clubbing, cyanosis, or edema; DP/PT/radial pulses 2+ bilaterally MS- no significant deformity or atrophy Skin- warm and dry, no rash or lesion Psych- euthymic mood, full affect Neuro- strength and sensation are intact  EKG is ordered. Personal review of EKG from today shows NSR at 73 bpm. Initial EKG showed brief run of SVT at ~ 106 bpm. Both placed in chart.   Additional studies reviewed include: Previous EP notes. Care everywhere notes including Echo report.   Assessment and Plan:  1. Paroxysmal atrial fibrillation Continue eliquis for CHA2DS2VASC of at least 5.    She converted to NSR after her 1st dose of po amiodarone per records. She maintained NSR 70s and significant bradycardia was NOT noted.  Echocardiogram showed "normal biventricular function." Amiodarone surveillance labs today.  No clear current indication for pacing. They would like to see Dr. Caryl Comes to discuss her case further, will schedule for 4-6 weeks for continued follow up.   2.  Chronic diastolic CHF Volume status stable on exam today. She may even be slightly dry.  Continue lasix 20 mg daily for now, with labwork today.  Suspect her volume was related to AF, and she may be able to use less, or as needed.   RTC 4-6 weeks to check back in with Dr. Caryl Comes per patient and family request. I will further review notes to see if any clear indication for pacing (None seen at this time; she followed up with EP post in [place] post admission, note available in Epic/Care everywhere)  ADDENDUM Note prior to discharge on 12/18/2019 says "No evidence thus far of bradycardia that would require a pacemaker but this will need to be monitored after she starts antiarrhythmic therapy." She was seen by EP in office 10/25 and note states that no significant bradycardia was noted during her admission.  Shirley Friar, PA-C  01/10/20 11:33 AM

## 2020-01-10 NOTE — Patient Instructions (Signed)
Medication Instructions:  *If you need a refill on your cardiac medications before your next appointment, please call your pharmacy*  Labs: Your physician has recommended that you have lab work today: CMET, CBC, TSH, and Magnesium Level  Follow-Up: At [facility], you and your health needs are our priority.  As part of our continuing mission to provide you with exceptional heart care, we have created designated Provider Care Teams.  These Care Teams include your primary Cardiologist (physician) and Advanced Practice Providers (APPs -  Physician Assistants and Nurse Practitioners) who all work together to provide you with the care you need, when you need it.  We recommend signing up for the patient portal called "MyChart".  Sign up information is provided on this After Visit Summary.  MyChart is used to connect with patients for Virtual Visits (Telemedicine).  Patients are able to view lab/test results, encounter notes, upcoming appointments, etc.  Non-urgent messages can be sent to your provider as well.   To learn more about what you can do with MyChart, go to [email or web address].    Your next appointment:   Your physician recommends that you keep your scheduled follow-up appointment in: 4-6 WEEKS with Dr. Caryl Comes  The format for your next appointment:   In Person with [name], MD

## 2020-01-11 ENCOUNTER — Telehealth: Payer: Self-pay

## 2020-01-11 LAB — CBC WITH DIFFERENTIAL/PLATELET
Basophils Absolute: 0.1 10*3/uL (ref 0.0–0.2)
Basos: 2 %
EOS (ABSOLUTE): 0.1 10*3/uL (ref 0.0–0.4)
Eos: 2 %
Hematocrit: 43.6 % (ref 34.0–46.6)
Hemoglobin: 14.7 g/dL (ref 11.1–15.9)
Immature Grans (Abs): 0 10*3/uL (ref 0.0–0.1)
Immature Granulocytes: 0 %
Lymphocytes Absolute: 2 10*3/uL (ref 0.7–3.1)
Lymphs: 30 %
MCH: 28.7 pg (ref 26.6–33.0)
MCHC: 33.7 g/dL (ref 31.5–35.7)
MCV: 85 fL (ref 79–97)
Monocytes Absolute: 0.5 10*3/uL (ref 0.1–0.9)
Monocytes: 7 %
Neutrophils Absolute: 3.9 10*3/uL (ref 1.4–7.0)
Neutrophils: 59 %
Platelets: 289 10*3/uL (ref 150–450)
RBC: 5.13 x10E6/uL (ref 3.77–5.28)
RDW: 12.7 % (ref 11.7–15.4)
WBC: 6.5 10*3/uL (ref 3.4–10.8)

## 2020-01-11 LAB — COMPREHENSIVE METABOLIC PANEL
ALT: 14 IU/L (ref 0–32)
AST: 17 IU/L (ref 0–40)
Albumin/Globulin Ratio: 1.9 (ref 1.2–2.2)
Albumin: 4.2 g/dL (ref 3.6–4.6)
Alkaline Phosphatase: 115 IU/L (ref 44–121)
BUN/Creatinine Ratio: 15 (ref 12–28)
BUN: 12 mg/dL (ref 8–27)
Bilirubin Total: 0.4 mg/dL (ref 0.0–1.2)
CO2: 27 mmol/L (ref 20–29)
Calcium: 9.1 mg/dL (ref 8.7–10.3)
Chloride: 99 mmol/L (ref 96–106)
Creatinine, Ser: 0.8 mg/dL (ref 0.57–1.00)
GFR calc Af Amer: 77 mL/min/{1.73_m2} (ref >59)
GFR calc non Af Amer: 67 mL/min/{1.73_m2} (ref >59)
Globulin, Total: 2.2 g/dL (ref 1.5–4.5)
Glucose: 77 mg/dL (ref 65–99)
Potassium: 4.5 mmol/L (ref 3.5–5.2)
Sodium: 141 mmol/L (ref 134–144)
Total Protein: 6.4 g/dL (ref 6.0–8.5)

## 2020-01-11 LAB — TSH: TSH: 2.65 u[IU]/mL (ref 0.450–4.500)

## 2020-01-11 LAB — MAGNESIUM: Magnesium: 2.4 mg/dL — ABNORMAL HIGH (ref 1.6–2.3)

## 2020-01-11 NOTE — Telephone Encounter (Signed)
Per DPR pt's daughter [name] is aware and agreeable to lab results

## 2020-01-11 NOTE — Telephone Encounter (Signed)
-----   Message from Shirley Friar, PA-C sent at 01/11/2020  8:59 AM EST ----- Can you please let her daughter [name] know that her labs look OK and we are not making any changes at this time (they requested a call)Thank you! Legrand Como [address]" Mc[place], PA-C  01/11/2020 8:58 AM

## 2020-01-14 ENCOUNTER — Ambulatory Visit: Payer: Medicare Other | Admitting: Internal Medicine

## 2020-01-14 NOTE — Progress Notes (Deleted)
Office Visit Note  Patient: Mariah Bryant             Date of Birth: 01/06/1931           MRN: [id number]             PCP: Inda Coke, PA Referring: Gregor Hams, MD Visit Date: 01/14/2020 Occupation: '@GUAROCC' @  Subjective:  No chief complaint on file.   History of Present Illness: Lenita Peregrina is a 86 y.o. female here for evaluation of positive ANA with hand pain.***  Labs reviewed 10/2019 ANA 1:320 Reflex ENA panel negative C3 C4 normal ESR normal CCP negative  Imaging reviewed 10/2019 Xray left hand extensive osteoarthritis changes, generalized osteopenia, no erosive changes  Activities of Daily Living:  Patient reports morning stiffness for *** {minute/hour:19697}.   Patient {ACTIONS;DENIES/REPORTS:21021675::"Denies"} nocturnal pain.  Difficulty dressing/grooming: {ACTIONS;DENIES/REPORTS:21021675::"Denies"} Difficulty climbing stairs: {ACTIONS;DENIES/REPORTS:21021675::"Denies"} Difficulty getting out of chair: {ACTIONS;DENIES/REPORTS:21021675::"Denies"} Difficulty using hands for taps, buttons, cutlery, and/or writing: {ACTIONS;DENIES/REPORTS:21021675::"Denies"}  No Rheumatology ROS completed.   PMFS History:  Patient Active Problem List   Diagnosis Date Noted  . RLS (restless legs syndrome) 08/27/2017  . Chronic toe pain, right third toe 08/27/2017  . 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 Macular degeneration of both eyes 08/27/2017  . Difficulty walking 08/27/2017  . Hearing loss, with bilateral hearing aids obtained from [facility] 08/19/2017  . Osteoporosis, on Prolia 08/16/2017  . Chronic pain of left knee 08/16/2017  . Chronic left-sided low back pain without sciatica 08/16/2017  . Macular degeneration 08/16/2017  . Orthostatic intolerance 08/16/2017  . Aortic atherosclerosis ([place]) 08/16/2017  . Degenerative arthritis of lumbar spine 08/16/2017  . Allergic rhinitis, on Flonase prn  08/15/2017  . PAF (paroxysmal atrial fibrillation) ([place]) 05/05/2017  . Status post total right knee replacement 09/17/2015  . Atrial paroxysmal tachycardia ([place]) 12/06/2012  . Brown's syndrome, with double vision 10/07/2010  . Hiatal hernia, without GERD 10/07/2010  . Mitral valve prolapse 10/07/2010  . Seborrheic dermatitis, controlled with [place] 10/07/2010  . Temporomandibular joint disorder, right side 10/07/2010  . Mild persistent asthma, on daily Pulmicort 07/17/2009  . Depressive disorder, on Lexapro 07/17/2009  . Hypercholesterolemia, no statin 07/17/2009  . Osteoarthritis 07/17/2009    Past Medical History:  Diagnosis Date  . Allergic rhinitis 08/15/2017  . Asthma, stable, moderate persistent 07/17/2009  . Atrial fibrillation ([place])   . 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 Chronic pain of left knee 08/16/2017  . Depressive disorder 07/17/2009  . Gastro-esophageal reflux disease without esophagitis 10/07/2010  . Hiatal hernia 10/07/2010  . Hypercholesterolemia 07/17/2009  . Hypogammaglobulinemia ([place]) 08/15/2017  . Macular degeneration 08/16/2017  . Orthostatic intolerance 08/16/2017  . Osteoarthritis 07/17/2009  . Osteoporosis 08/16/2017  . Seborrheic dermatitis 10/07/2010  . Sleep apnea 07/17/2009  . Temporomandibular joint disorder 10/07/2010    Family History  Problem Relation Age of Onset  . Stroke Mother   . Cancer Sister   . Cancer Brother    Past Surgical History:  Procedure Laterality Date  . APPENDECTOMY    . CATARACT EXTRACTION, BILATERAL    . CHOLECYSTECTOMY    . HYSTERECTOMY ABDOMINAL WITH SALPINGO-OOPHORECTOMY    . REPLACEMENT TOTAL KNEE     Social History   Social History Narrative   Lives with daughter and family. Has upstairs suite at the moment. They are working on getting her to the first floor.    Immunization History  Administered Date(s) Administered  . Fluad Quad(high Dose  65+) 11/14/2018, 11/26/2019  . Hepatitis B, adult 12/29/2010, 01/28/2011, 06/18/2011  .  Influenza, High Dose Seasonal PF 01/10/2018  . Influenza, Seasonal, Injecte, Preservative Fre 12/22/2010  . Moderna SARS-COVID-2 Vaccination 03/13/2019, 04/10/2019  . Pneumococcal Polysaccharide-23 07/14/2009  . Zoster 02/11/2011     Objective: Vital Signs: There were no vitals taken for this visit.   Physical Exam   Musculoskeletal Exam: ***  CDAI Exam: CDAI Score: -- Patient Global: --; Provider Global: -- Swollen: --; Tender: -- Joint Exam 01/14/2020   No joint exam has been documented for this visit   There is currently no information documented on the homunculus. Go to the Rheumatology activity and complete the homunculus joint exam.  Investigation: No additional findings.  Imaging: No results found.  Recent Labs: Lab Results  Component Value Date   WBC 6.5 01/10/2020   HGB 14.7 01/10/2020   PLT 289 01/10/2020   NA 141 01/10/2020   K 4.5 01/10/2020   CL 99 01/10/2020   CO2 27 01/10/2020   GLUCOSE 77 01/10/2020   BUN 12 01/10/2020   CREATININE 0.80 01/10/2020   BILITOT 0.4 01/10/2020   ALKPHOS 115 01/10/2020   AST 17 01/10/2020   ALT 14 01/10/2020   PROT 6.4 01/10/2020   ALBUMIN 4.2 01/10/2020   CALCIUM 9.1 01/10/2020   GFRAA 77 01/10/2020    Speciality Comments: No specialty comments available.  Procedures:  No procedures performed Allergies: Amoxicillin, Epinephrine, Shellfish allergy, Morphine and related, Atarax [hydroxyzine], Biaxin [clarithromycin], Codeine, Doxycycline, Novocain [procaine], and Penicillins   Assessment / Plan:     Visit Diagnoses: No diagnosis found.  Orders: No orders of the defined types were placed in this encounter.  No orders of the defined types were placed in this encounter.   Face-to-face time spent with patient was *** minutes. Greater than 50% of time was spent in counseling and coordination of care.  Follow-Up Instructions: No follow-ups on file.   Collier Salina, MD  Note - This record has been  created using [employer].  Chart creation errors have been sought, but may not always  have been located. Such creation errors do not reflect on  the standard of medical care.

## 2020-01-27 NOTE — Progress Notes (Signed)
Office Visit Note  Patient: Mariah Bryant             Date of Birth: 1930-07-07           MRN: [id number]             PCP: Inda Coke, PA Referring: Gregor Hams, MD Visit Date: 01/28/2020   Subjective:   History of Present Illness: Mariah Bryant is a 86 y.o. female here for evaluation of arthritis of bilateral hands and positive ANA test. She has experienced progressive bony nodules over the joints of both hands for years with deviation and decreased ROM. She reports a history of being suspected to have rheumatoid arthritis many years ago but never treated for this. She also has a recurring cyst over the left 4th PIP joint that has been drained multiple times but recurs. She does not complain of significant pain, swelling, or redness of her fingers besides the swelling of the cyst. Previous workup included xray of left hand with severe osteoarthritis of numerous joints. She has noticed very slow progression over time of symptoms. She has morning stiffness lasting 1-2 hours but also has increased joint pain with use. She denies any recent problems with skin rashes, hair loss, oral ulcers, lymphadenopathy, or finger discoloration.  Labs reviewed 12/2019 CBC normal TSH normal CMP normal  10/2019 ANA 1:320 nuclear, nucleolar ESR 2 CCP negative Complement C3 C4 normal dsDNA negative RNP negative Smith negative SSA negative SSB negative TPO negative Scl-70 negative RF negative  Imaging reviewed 10/2019 Xray left hand No erosive changes. Moderate osteoarthritis of the first [place] joint and first MCP joint. Moderate osteoarthritis of the left second, third and fifth D IP joints. Moderate osteoarthritis of the remainder of the PIP and DIP joints bilaterally. Severe osteoarthritis of the third MCP joint. Mild osteoarthritis of the second MCP joint. Moderate osteoarthritis of the scaphotrapeziotrapezoid joint.  [place] left hand limited Diagnostic Limited MSK Ultrasound KG:MWNU  fourth digit dorsal Cystic fluid-filled structure dorsal to the IP joint. Impression:Ganglion cyst   Activities of Daily Living:  Patient reports morning stiffness for 1-2 hours.   Patient Denies nocturnal pain.  Difficulty dressing/grooming: Denies Difficulty climbing stairs: Denies Difficulty getting out of chair: Reports Difficulty using hands for taps, buttons, cutlery, and/or writing: Denies  Review of Systems  Constitutional: Positive for fatigue.  HENT: Negative for mouth sores, mouth dryness and nose dryness.   Eyes: Positive for visual disturbance and dryness. Negative for pain and itching.  Respiratory: Positive for shortness of breath. Negative for cough, hemoptysis and difficulty breathing.   Cardiovascular: Positive for palpitations. Negative for chest pain and swelling in legs/feet.  Gastrointestinal: Negative for abdominal pain, blood in stool, constipation and diarrhea.  Endocrine: Negative for increased urination.  Genitourinary: Negative for painful urination.  Musculoskeletal: Positive for arthralgias, joint pain, myalgias, morning stiffness and myalgias. Negative for joint swelling, muscle weakness and muscle tenderness.  Skin: Negative for color change, rash and redness.  Allergic/Immunologic: Negative for susceptible to infections.  Neurological: Positive for parasthesias. Negative for dizziness, numbness, headaches, memory loss and weakness.  Hematological: Negative for swollen glands.  Psychiatric/Behavioral: Positive for confusion. Negative for sleep disturbance.    PMFS History:  Patient Active Problem List   Diagnosis Date Noted  . Positive ANA (antinuclear antibody) 01/28/2020  . RLS (restless legs syndrome) 08/27/2017  . Chronic toe pain, right third toe 08/27/2017  . 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 Macular  degeneration of both eyes 08/27/2017  . Difficulty walking 08/27/2017  . Hearing  loss, with bilateral hearing aids obtained from [facility] 08/19/2017  . Osteoporosis, on Prolia 08/16/2017  . Chronic pain of left knee 08/16/2017  . Chronic left-sided low back pain without sciatica 08/16/2017  . Macular degeneration 08/16/2017  . Orthostatic intolerance 08/16/2017  . Aortic atherosclerosis ([place]) 08/16/2017  . Degenerative arthritis of lumbar spine 08/16/2017  . Allergic rhinitis, on Flonase prn 08/15/2017  . PAF (paroxysmal atrial fibrillation) ([place]) 05/05/2017  . Status post total right knee replacement 09/17/2015  . Atrial paroxysmal tachycardia ([place]) 12/06/2012  . Brown's syndrome, with double vision 10/07/2010  . Hiatal hernia, without GERD 10/07/2010  . Mitral valve prolapse 10/07/2010  . Seborrheic dermatitis, controlled with Aloe 10/07/2010  . Temporomandibular joint disorder, right side 10/07/2010  . Mild persistent asthma, on daily Pulmicort 07/17/2009  . Depressive disorder, on Lexapro 07/17/2009  . Hypercholesterolemia, no statin 07/17/2009  . Osteoarthritis 07/17/2009    Past Medical History:  Diagnosis Date  . Allergic rhinitis 08/15/2017  . Asthma, stable, moderate persistent 07/17/2009  . Atrial fibrillation ([place])   . Brady-tachy syndrome ([place])    Per patient  . 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 Chronic pain of left knee 08/16/2017  . Depressive disorder 07/17/2009  . Gastro-esophageal reflux disease without esophagitis 10/07/2010  . Hiatal hernia 10/07/2010  . Hypercholesterolemia 07/17/2009  . Hypogammaglobulinemia ([place]) 08/15/2017  . Macular degeneration 08/16/2017  . Orthostatic intolerance 08/16/2017  . Osteoarthritis 07/17/2009  . Osteoporosis 08/16/2017  . Seborrheic dermatitis 10/07/2010  . Sleep apnea 07/17/2009  . Temporomandibular joint disorder 10/07/2010    Family History  Problem Relation Age of Onset  . Stroke Mother   . Arthritis Mother   . Irregular heart beat Father   . Cancer Sister   . Cancer Brother   . Congestive Heart Failure Paternal  Grandmother    Past Surgical History:  Procedure Laterality Date  . APPENDECTOMY    . CATARACT EXTRACTION, BILATERAL    . CHOLECYSTECTOMY    . HYSTERECTOMY ABDOMINAL WITH SALPINGO-OOPHORECTOMY    . REPLACEMENT TOTAL KNEE     Social History   Social History Narrative   Lives with daughter and family. Has upstairs suite at the moment. They are working on getting her to the first floor.    Immunization History  Administered Date(s) Administered  . Fluad Quad(high Dose 65+) 11/14/2018, 11/26/2019  . Hepatitis B, adult 12/29/2010, 01/28/2011, 06/18/2011  . Influenza, High Dose Seasonal PF 01/10/2018  . Influenza, Seasonal, Injecte, Preservative Fre 12/22/2010  . Moderna SARS-COVID-2 Vaccination 03/13/2019, 04/10/2019  . Pneumococcal Polysaccharide-23 07/14/2009  . Zoster 02/11/2011     Objective: Vital Signs: BP 103/62 (BP Location: Left Arm, Patient Position: Sitting, Cuff Size: Small)   Pulse 64   Ht 5' 1.25" (1.556 m)   Wt 150 lb 3.2 oz (68.1 kg)   BMI 28.15 kg/m    Physical Exam HENT:     Head: Normocephalic.     Mouth/Throat:     Mouth: Mucous membranes are moist.     Pharynx: Oropharynx is clear.  Eyes:     Conjunctiva/sclera: Conjunctivae normal.  Cardiovascular:     Rate and Rhythm: Normal rate and regular rhythm.  Pulmonary:     Effort: Pulmonary effort is normal.     Breath sounds: Normal breath sounds.  Neurological:     Mental Status: She is alert.      Musculoskeletal Exam:  Neck full range of motion Shoulder  and elbows slightly reduced ROM near normal no swelling Significant bony nodules over MCP, PIP, and RIP joints in bilateral hands, mild ulnar deviation, slightly reduced PIP flexion ROM, firm soft nodule over left 4th PIP dorsal aspect, interosseus muscle wasting and mild tissue hypertrophy without obvious swelling Bilateral knee patellofemoral crepitus and mild medial joint tenderness   Investigation: No additional findings.  Imaging: No  results found.  Recent Labs: Lab Results  Component Value Date   WBC 6.5 01/10/2020   HGB 14.7 01/10/2020   PLT 289 01/10/2020   NA 141 01/10/2020   K 4.5 01/10/2020   CL 99 01/10/2020   CO2 27 01/10/2020   GLUCOSE 77 01/10/2020   BUN 12 01/10/2020   CREATININE 0.80 01/10/2020   BILITOT 0.4 01/10/2020   ALKPHOS 115 01/10/2020   AST 17 01/10/2020   ALT 14 01/10/2020   PROT 6.4 01/10/2020   ALBUMIN 4.2 01/10/2020   CALCIUM 9.1 01/10/2020   GFRAA 77 01/10/2020    Speciality Comments: No specialty comments available.  Procedures:  No procedures performed Allergies: Amoxicillin, Epinephrine, Shellfish allergy, Morphine and related, Atarax [hydroxyzine], Biaxin [clarithromycin], Codeine, Doxycycline, Novocain [procaine], and Penicillins   Assessment / Plan:     Visit Diagnoses: Primary osteoarthritis of both hands - Plan: Ambulatory referral to Occupational Therapy  Significant osteoarthritis changes are seen on exam. May have burnt out inflammatory arthritis but is now seronegative based on recent workup. Limited point of care ultrasound inspection does not show effusion or color doppler enhancement. Discussed treatment options for OA-she is not a good candidate for oral NSAIDs but can use topical and will refer to occupational therapy for treatment and joint protection education.  Positive ANA (antinuclear antibody)  She does not present clinical criteria of lupus or inflammatory joint changes on exam.  Based on already completing ENA panel all negative I do not see signs of active autoimmune disease at this time no new treatment recommended.  Orders: Orders Placed This Encounter  Procedures  . Ambulatory referral to Occupational Therapy   No orders of the defined types were placed in this encounter.   Follow-Up Instructions: Return if symptoms worsen or fail to improve.   Collier Salina, MD  Note - This record has been created using [employer].  Chart  creation errors have been sought, but may not always  have been located. Such creation errors do not reflect on  the standard of medical care.

## 2020-01-28 ENCOUNTER — Encounter: Payer: Self-pay | Admitting: Internal Medicine

## 2020-01-28 ENCOUNTER — Other Ambulatory Visit: Payer: Self-pay

## 2020-01-28 ENCOUNTER — Ambulatory Visit (INDEPENDENT_AMBULATORY_CARE_PROVIDER_SITE_OTHER): Payer: Medicare Other | Admitting: Internal Medicine

## 2020-01-28 VITALS — BP 103/62 | HR 64 | Ht 61.25 in | Wt 150.2 lb

## 2020-01-28 DIAGNOSIS — M19041 Primary osteoarthritis, right hand: Secondary | ICD-10-CM

## 2020-01-28 DIAGNOSIS — R768 Other specified abnormal immunological findings in serum: Secondary | ICD-10-CM

## 2020-01-28 DIAGNOSIS — M1710 Unilateral primary osteoarthritis, unspecified knee: Secondary | ICD-10-CM | POA: Diagnosis not present

## 2020-01-28 DIAGNOSIS — M19042 Primary osteoarthritis, left hand: Secondary | ICD-10-CM

## 2020-01-28 NOTE — Patient Instructions (Signed)

## 2020-01-31 DIAGNOSIS — Z23 Encounter for immunization: Secondary | ICD-10-CM | POA: Diagnosis not present

## 2020-02-04 ENCOUNTER — Other Ambulatory Visit: Payer: Self-pay | Admitting: Physician Assistant

## 2020-02-04 MED ORDER — ALPRAZOLAM 0.25 MG PO TABS: 0.2500 mg | ORAL_TABLET | Freq: Two times a day (BID) | ORAL | 3 refills | Status: DC | PRN

## 2020-02-05 ENCOUNTER — Other Ambulatory Visit: Payer: Self-pay

## 2020-02-05 ENCOUNTER — Encounter: Payer: Self-pay | Admitting: Occupational Therapy

## 2020-02-05 ENCOUNTER — Ambulatory Visit: Payer: Medicare Other | Attending: Internal Medicine | Admitting: Occupational Therapy

## 2020-02-05 DIAGNOSIS — M25512 Pain in left shoulder: Secondary | ICD-10-CM | POA: Diagnosis not present

## 2020-02-05 DIAGNOSIS — M25641 Stiffness of right hand, not elsewhere classified: Secondary | ICD-10-CM | POA: Diagnosis not present

## 2020-02-05 DIAGNOSIS — M25642 Stiffness of left hand, not elsewhere classified: Secondary | ICD-10-CM | POA: Diagnosis not present

## 2020-02-05 DIAGNOSIS — R41842 Visuospatial deficit: Secondary | ICD-10-CM | POA: Insufficient documentation

## 2020-02-05 DIAGNOSIS — M6281 Muscle weakness (generalized): Secondary | ICD-10-CM | POA: Diagnosis not present

## 2020-02-05 DIAGNOSIS — R278 Other lack of coordination: Secondary | ICD-10-CM | POA: Diagnosis not present

## 2020-02-05 NOTE — Therapy (Signed)
[facility] [address] [place], [place], [address] Phone: [phone]   Fax:  [phone]  Occupational Therapy Evaluation  Patient Details  Name: Mariah Bryant MRN: [id number] Date of Birth: 07-22-1930 Referring Provider (OT): Chrisopher Rice   Encounter Date: 02/05/2020   OT End of Session - 02/05/20 1442    Visit Number 1    Number of Visits 7    Date for OT Re-Evaluation 03/18/20    Authorization Type Medicare & Alecia Lemming Supplement    Authorization Time Period Follow Medicare Guidelines: 10th visit PN, KX Modifier    Authorization - Visit Number 1    Authorization - Number of Visits 7    Progress Note Due on Visit 7    OT Start Time 1113   pt was late to appt   OT Stop Time 1147    OT Time Calculation (min) 34 min    Activity Tolerance Patient tolerated treatment well    Behavior During Therapy [facility] for tasks assessed/performed           Past Medical History:  Diagnosis Date  . Allergic rhinitis 08/15/2017  . Asthma, stable, moderate persistent 07/17/2009  . Atrial fibrillation ([place])   . Brady-tachy syndrome ([place])    Per patient  . 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 Chronic pain of left knee 08/16/2017  . Depressive disorder 07/17/2009  . Gastro-esophageal reflux disease without esophagitis 10/07/2010  . Hiatal hernia 10/07/2010  . Hypercholesterolemia 07/17/2009  . Hypogammaglobulinemia ([place]) 08/15/2017  . Macular degeneration 08/16/2017  . Orthostatic intolerance 08/16/2017  . Osteoarthritis 07/17/2009  . Osteoporosis 08/16/2017  . Seborrheic dermatitis 10/07/2010  . Sleep apnea 07/17/2009  . Temporomandibular joint disorder 10/07/2010    Past Surgical History:  Procedure Laterality Date  . APPENDECTOMY    . CATARACT EXTRACTION, BILATERAL    . CHOLECYSTECTOMY    . HYSTERECTOMY ABDOMINAL WITH SALPINGO-OOPHORECTOMY    . REPLACEMENT TOTAL KNEE      There were no vitals filed for this visit.   Subjective  Assessment - 02/05/20 1254    Subjective  Pt is an 86 year old female that presents to neuro OPOT with osteoarthritis in BUE with LUE > RUE. Pt denies pain in her hands but reports some pain in the LUE shoulder during ROM. Pt reports she had a decline and beginning of the OA after a fall in 1998 down a flight of cement stairs. Pt reports being independent or "manages" ADLs however takes longer and with adaptive strategies. Pt reports difficulty with fasteners, inserting hearing aids and changing the batteries and dropping things frequently.    Pertinent History PMH - macular degeneration, OA, A-fib, GERD, sleep apnea    Limitations Fall Risk. No Driving. HOH    Patient Stated Goals "get my hands in better working condition"    Currently in Pain? Yes   not in hands - in shoulders with ROM   Pain Score 3     Pain Location Shoulder    Pain Orientation Left    Pain Descriptors / Indicators Aching;Sore    Pain Type Chronic pain    Pain Onset More than a month ago    Pain Frequency Intermittent             [facility] OT Assessment - 02/05/20 1116      Assessment   Medical Diagnosis Osteoarthritis (OA)    Referring Provider (OT) Chrisopher Rice    Hand Dominance Left      Precautions  Precautions Fall    Required Braces or Orthoses --   Pt has none at this time     Balance Screen   Has the patient fallen in the past 6 months No      Home  Environment   Family/patient expects to be discharged to: Private residence    Living Arrangements Children    Type of [place]      Prior Function   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     Leisure likes to read and go for walks      ADL   Eating/Feeding Modified independent   increased time required and some drops per pt report   Grooming Modified independent    Upper Body Bathing Modified independent    Lower Body Bathing Modified independent    Upper Body Dressing Increased  time    Lower Body Dressing Increased time    Armed forces technical officer Modified independent    Toileting - Clothing Manipulation Modified independent    [place] Transfer Modified independent    ADL comments pt reports that she "manages" with ADLs but requires increased time and has some drops with tasks.      IADL   Prior Level of Function Shopping independent PLOF    Shopping Assistance for transportation    Prior Level of Function Light Housekeeping independent PLOF    Light Housekeeping Maintains house alone or with occasional assistance   living with daughter and does not do a lot of heavy duty    Prior Level of Function Meal Prep independent PLOF    Meal Prep Plans, prepares and serves adequate meals independently    Prior Level of Function Community Mobility independent [place] on family or friends for transportation    Prior Level of Function Medication Managment independent PLOF    Medication Management Is responsible for taking medication in correct dosages at correct time    Prior Level of Function Financial Management independent PLOF    Financial Management Requires assistance;Dependent      Mobility   Mobility Status Comments pt ambulates without assistive device      Written Expression   Dominant Hand Left    Handwriting Increased time   uncoordinated strokes   Written Experience Exceptions to [place] - History   Baseline Vision Wears glasses all the time    Visual History Macular degeneration    Additional Comments likes to read but uses large print like on a computer      Observation/Other Assessments   Focus on Therapeutic Outcomes (FOTO)  N/A      Sensation   Light Touch Appears Intact    Hot/Cold Appears Intact      Coordination   Gross Motor Movements are Fluid and Coordinated No    Fine Motor Movements are Fluid and Coordinated No    Coordination and Movement Description Tremors  in LUE     9 Hole Peg Test Right;Left    Right 9 Hole Peg Test 39.03    Left 9 Hole Peg Test 31.53    Box and Blocks RUE 48, LUE 39    Tremors tremors present in LUE      ROM / Strength   AROM / PROM / Strength Strength      Strength   Overall Strength Comments RUE 5/5  Strength Assessment Site Shoulder;Elbow    Right/Left Shoulder Left    Left Shoulder Flexion 4-/5    Left Shoulder ABduction 4-/5    Right/Left Elbow Left    Left Elbow Flexion 5/5    Left Elbow Extension 5/5      Left Hand AROM   L Little PIP 0-100 -35 Degrees      Hand Function   Right Hand Gross Grasp Functional    Right Hand Grip (lbs) 33.2    Left Hand Gross Grasp Functional    Left Hand Grip (lbs) 26.8                           OT Education - 02/05/20 1641    Education Details Education on role and purpose of OT    Person(s) Educated Patient    Methods Explanation    Comprehension Verbalized understanding            OT Short Term Goals - 02/05/20 1444      OT SHORT TERM GOAL #1   Title Pt will be independent with initial HEP 02/26/20    Time 3    Period Weeks    Status New    Target Date 02/26/20      OT SHORT TERM GOAL #2   Title Pt will verbalize understanding of arthritis management and strategies.    Time 3    Period Weeks    Status New      OT SHORT TERM GOAL #3   Title Pt will report a decrease in drops from LUE on a daily basis    Time 3    Period Weeks    Status New      OT SHORT TERM GOAL #4   Title Pt will demonstrate improved fine motor coordination as evidenced by reporting improved ease with changing hearing aid batteries    Time 3    Period Weeks    Status New             OT Long Term Goals - 02/05/20 1622      OT LONG TERM GOAL #1   Title Pt will be independent with updated HEP 03/18/2020    Time 6    Period Weeks    Status New    Target Date 03/18/20      OT LONG TERM GOAL #2   Title Pt will demonstrate improved functional use of  LUE by improving Box and Blocks score to 45 or more    Baseline RUE 48, LUE 39    Time 6    Period Weeks    Status New      OT LONG TERM GOAL #3   Title Pt will verbalize understanding and demonstrate compliance with any splint and/or orthoses PRN    Time 6    Period Weeks    Status New      OT LONG TERM GOAL #4   Title Pt will demonstrate improved independence and ease with cutting up touch food utilizing BUE and adapted strategies and equipment PRN    Time 6    Period Weeks    Status New                 Plan - 02/05/20 1306    Clinical Impression Statement Pt is an 86 year old female that presents to neuro OPOT with history of osteoarthritis with significant impairment in LUE hand. Pt with PMH positive for  Macular Degeneration, GERD, sleep apnea. Pt presents with deficits with range of motion, pain, weakness, and limited education regarding current injury and condition for care and maintainance. Skilled occupational therapy is recommended to target areas of deficit in order to increase overall function.    OT Occupational Profile and History Problem Focused Assessment - Including review of records relating to presenting problem    Occupational performance deficits (Please refer to evaluation for details): IADL's;ADL's;Leisure    Body Structure / Function / Physical Skills ADL;Strength;Decreased knowledge of use of DME;Dexterity;GMC;Pain;UE functional use;IADL;ROM;Coordination;FMC;Flexibility;Vision    Dietitian;Understand;Memory    Rehab Potential Fair    Clinical Decision Making Limited treatment options, no task modification necessary    Comorbidities Affecting Occupational Performance: None    Modification or Assistance to Complete Evaluation  No modification of tasks or assist necessary to complete eval    OT Frequency 1x / week    OT Duration 6 weeks    OT Treatment/Interventions Patient/family education;Manual Therapy;Energy  conservation;Paraffin;Electrical Stimulation;Cryotherapy;Neuromuscular education;Passive range of motion;Visual/perceptual remediation/compensation;Ultrasound;Therapeutic exercise;Cognitive remediation/compensation;Therapeutic activities;Splinting;DME and/or AE instruction;Fluidtherapy;Moist Heat;Self-care/ADL training;Contrast Bath    Plan paraffin, joint protection strategies, HEP coordination    Consulted and Agree with Plan of Care Patient           Patient will benefit from skilled therapeutic intervention in order to improve the following deficits and impairments:   Body Structure / Function / Physical Skills: ADL, Strength, Decreased knowledge of use of DME, Dexterity, GMC, Pain, UE functional use, IADL, ROM, Coordination, FMC, Flexibility, Vision Cognitive Skills: Thought, Learn, Understand, Memory     Visit Diagnosis: Stiffness of left hand, not elsewhere classified - Plan: Ot plan of care cert/re-cert  Stiffness of right hand, not elsewhere classified - Plan: Ot plan of care cert/re-cert  Muscle weakness (generalized) - Plan: Ot plan of care cert/re-cert  Other lack of coordination - Plan: Ot plan of care cert/re-cert  Acute pain of left shoulder - Plan: Ot plan of care cert/re-cert  Visuospatial deficit - Plan: Ot plan of care cert/re-cert    Problem List Patient Active Problem List   Diagnosis Date Noted  . Positive ANA (antinuclear antibody) 01/28/2020  . RLS (restless legs syndrome) 08/27/2017  . Chronic toe pain, right third toe 08/27/2017  . 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 Macular degeneration of both eyes 08/27/2017  . Difficulty walking 08/27/2017  . Hearing loss, with bilateral hearing aids [facility] 08/19/2017  . Osteoporosis, on Prolia 08/16/2017  . Chronic pain of left knee 08/16/2017  . Chronic left-sided low back pain without sciatica 08/16/2017  . Macular degeneration 08/16/2017   . Orthostatic intolerance 08/16/2017  . Aortic atherosclerosis ([place]) 08/16/2017  . Degenerative arthritis of lumbar spine 08/16/2017  . Allergic rhinitis, on Flonase prn 08/15/2017  . PAF (paroxysmal atrial fibrillation) ([place]) 05/05/2017  . Status post total right knee replacement 09/17/2015  . Atrial paroxysmal tachycardia ([place]) 12/06/2012  . Brown's syndrome, with double vision 10/07/2010  . Hiatal hernia, without GERD 10/07/2010  . Mitral valve prolapse 10/07/2010  . Seborrheic dermatitis, controlled with Aloe 10/07/2010  . Temporomandibular joint disorder, right side 10/07/2010  . Mild persistent asthma, on daily Pulmicort 07/17/2009  . Depressive disorder, on Lexapro 07/17/2009  . Hypercholesterolemia, no statin 07/17/2009  . Osteoarthritis 07/17/2009    Zachery Conch MOT, OTR/L  02/05/2020, 4:42 PM  [facility] [address] [place], [place], [address] Phone: [phone]  Fax:  [phone]  Name: Mariah Bryant MRN: [id number] Date of Birth: 16-Jan-1931

## 2020-02-14 ENCOUNTER — Ambulatory Visit: Payer: Medicare Other | Admitting: Occupational Therapy

## 2020-02-15 DIAGNOSIS — I509 Heart failure, unspecified: Secondary | ICD-10-CM | POA: Insufficient documentation

## 2020-02-18 ENCOUNTER — Encounter: Payer: Medicare Other | Admitting: Occupational Therapy

## 2020-02-21 ENCOUNTER — Telehealth: Payer: Self-pay

## 2020-02-21 ENCOUNTER — Other Ambulatory Visit: Payer: Self-pay

## 2020-02-21 ENCOUNTER — Telehealth: Payer: Medicare Other | Admitting: Internal Medicine

## 2020-02-21 VITALS — HR 72 | Ht 60.0 in | Wt 144.0 lb

## 2020-02-21 NOTE — Patient Instructions (Signed)
Medication Instructions:  Your physician recommends that you continue on your current medications as directed. Please refer to the Current Medication list given to you today.  *If you need a refill on your cardiac medications before your next appointment, please call your pharmacy*   Lab Work: Amiodarone labs at next visit  If you have labs (blood work) drawn today and your tests are completely normal, you will receive your results only by: [name] MyChart Message (if you have MyChart) OR . A paper copy in the mail If you have any lab test that is abnormal or we need to change your treatment, we will call you to review the results.   Testing/Procedures: None ordered.    Follow-Up: At [facility], you and your health needs are our priority.  As part of our continuing mission to provide you with exceptional heart care, we have created designated Provider Care Teams.  These Care Teams include your primary Cardiologist (physician) and Advanced Practice Providers (APPs -  Physician Assistants and Nurse Practitioners) who all work together to provide you with the care you need, when you need it.  We recommend signing up for the patient portal called "MyChart".  Sign up information is provided on this After Visit Summary.  MyChart is used to connect with patients for Virtual Visits (Telemedicine).  Patients are able to view lab/test results, encounter notes, upcoming appointments, etc.  Non-urgent messages can be sent to your provider as well.   To learn more about what you can do with MyChart, go to [email or web address].    Your next appointment:   Tuesday 04/29/2020 at 1120am with Oda Kilts PA-C

## 2020-02-21 NOTE — Progress Notes (Unsigned)
Electrophysiology TeleHealth Note   Due to national recommendations of social distancing due to COVID 19, an audio/video telehealth visit is felt to be most appropriate for this patient at this time.  See MyChart message from today for the patient's consent to telehealth for [facility].   Date:  02/21/2020   ID:  Mariah Bryant, DOB Mar 27, 1930, MRN [id number]  Location: patient's home  Provider location: [address] [place]  Evaluation Performed: Follow-up visit  PCP:  Inda Coke, PA  Cardiologist:     Electrophysiologist:  SK   Chief Complaint:  Afib and chf   History of Present Illness:    Mariah Bryant is a 86 y.o. female who presents via audio/video conferencing for a telehealth visit today.  Since last being seen in our clinic for orthostatic hypotension, atrial fibrillation Rx with Apixoban    the patient reports hospitalized in [facility] 2[facility]2 CHF and started on amiodarone, and furosemide  The patient denies chest pain,DOE  nocturnal dyspnea none , orthopnea stable  peripheral edema mild.  There have been no lightheadedness  or syncope--has tachy palpitations most every morning and then felt no longer   Records reviewed from [facility] hospitaliz[facility] (See Below)   DATE TEST EF   3//14 Echo  60-65 %   6/17 Echo  70 % LA normal  1021 Echo 65%    Date Cr K TSH LFT Hgb  10/18 0.75 4.8   14.9  10/20 0.71 4.1   14.1  11/21 0.8 4.5 2.69 17 14.7   Pt has not had Covid; has taken Vaccine   The patient denies symptoms of fevers, chills, cough, or new SOB worrisome for COVID 19.    Past Medical History:  Diagnosis Date  . Allergic rhinitis 08/15/2017  . Asthma, stable, moderate persistent 07/17/2009  . Atrial fibrillation ([place])   . Brady-tachy syndrome ([place])    Per patient  . 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 Chronic pain of left knee 08/16/2017  . Depressive disorder 07/17/2009  . Gastro-esophageal reflux disease without esophagitis 10/07/2010   . Hiatal hernia 10/07/2010  . Hypercholesterolemia 07/17/2009  . Hypogammaglobulinemia ([place]) 08/15/2017  . Macular degeneration 08/16/2017  . Orthostatic intolerance 08/16/2017  . Osteoarthritis 07/17/2009  . Osteoporosis 08/16/2017  . Seborrheic dermatitis 10/07/2010  . Sleep apnea 07/17/2009  . Temporomandibular joint disorder 10/07/2010    Past Surgical History:  Procedure Laterality Date  . APPENDECTOMY    . CATARACT EXTRACTION, BILATERAL    . CHOLECYSTECTOMY    . HYSTERECTOMY ABDOMINAL WITH SALPINGO-OOPHORECTOMY    . REPLACEMENT TOTAL KNEE      Current Outpatient Medications  Medication Sig Dispense Refill  . albuterol (PROVENTIL HFA;VENTOLIN HFA) 108 (90 Base) MCG/ACT inhaler Inhale 2 puffs into the lungs every 4 (four) hours as needed for wheezing or shortness of breath.    . ALPRAZolam (XANAX) 0.25 MG tablet Take 1 tablet (0.25 mg total) by mouth 2 (two) times daily as needed for anxiety or sleep. 30 tablet 3  . amiodarone (PACERONE) 200 MG tablet Take 1 tablet (200 mg total) by mouth daily. 90 tablet 3  . budesonide (PULMICORT) 180 MCG/ACT inhaler Take 2 puffs by mouth 2 (two) times daily as needed.    [name] denosumab (PROLIA) 60 MG/ML SOSY injection Inject 60 mg into the skin every 6 (six) months.    [name] ELIQUIS 5 MG TABS tablet TAKE 1 TABLET BY MOUTH TWICE A DAY 60 tablet 5  . escitalopram (LEXAPRO) 10 MG tablet  Take 1 tablet (10 mg total) by mouth daily. (Patient taking differently: Take 15 mg by mouth daily.) 90 tablet 2  . fluticasone (FLONASE) 50 MCG/ACT nasal spray Place 2 sprays into both nostrils 2 (two) times daily as needed for allergies or rhinitis.    . furosemide (LASIX) 20 MG tablet Take 1 tablet (20 mg total) by mouth daily. 90 tablet 3  . Multiple Vitamins-Minerals (PRESERVISION AREDS 2 PO) Take 1 tablet by mouth 2 (two) times daily.    . Cholecalciferol 25 MCG (1000 UT) tablet Take by mouth daily. (Patient not taking: Reported on 02/21/2020)    . clotrimazole (LOTRIMIN) 1  % cream daily. (Patient not taking: Reported on 02/21/2020)    . Multiple Vitamin (MULTIVITAMIN) tablet Take 1 tablet by mouth daily. (Patient not taking: No sig reported)     No current facility-administered medications for this visit.    Allergies:   Amoxicillin, Epinephrine, Shellfish allergy, Morphine and related, Atarax [hydroxyzine], Biaxin [clarithromycin], Codeine, Doxycycline, Novocain [procaine], and Penicillins      ROS:  Please see the history of present illness.   All other systems are personally reviewed and negative.    Exam:    Vital Signs:  Pulse 72   Ht 5' (1.524 m)   Wt 144 lb (65.3 kg)   BMI 28.12 kg/m     Labs/Other Tests and Data Reviewed:    Recent Labs: 01/10/2020: ALT 14; BUN 12; Creatinine, Ser 0.80; Hemoglobin 14.7; Magnesium 2.4; Platelets 289; Potassium 4.5; Sodium 141; TSH 2.650   Wt Readings from Last 3 Encounters:  02/21/20 144 lb (65.3 kg)  01/28/20 150 lb 3.2 oz (68.1 kg)  01/10/20 147 lb (66.7 kg)     Other studies personally reviewed: Additional studies/ records that were reviewed today include: *    ASSESSMENT & PLAN:    Atrial fibrillation-paroxysmal  Falls  Orthostatic hypotension  HFpEF  Fluid status is euvolemic And without clinical orthostasis, would continue her diuretics as they are  Am palpitations may be similar to the nonsustained atrial tachy seen on ECG 11/21  For now, given paucity of assoc symptoms, would continue amio at current dose, not monitor to clarify and revisit at followup  /On Anticoagulation;  No bleeding issues  We have discussed the physiology of heart failure including the importance of salt restriction and fluid restriction and have reviewed sources of dietary salt and water.   COVID 19 screen The patient denies symptoms of COVID 19 at this time.  The importance of social distancing was discussed today.  Follow-up:  34m AT-PA    Current medicines are reviewed at length with the patient today.    The patient has concerns regarding her medicines addressed above  The following changes were made today:  none  Labs/ tests ordered today include:   No orders of the defined types were placed in this encounter.   Future tests ( post COVID )  Amio labs at followup  Patient Risk:  after full review of this patients clinical status, I feel that they are at moderate  risk at this time.  Today, I have spent 23 minutes with the patient with telehealth technology discussing the above.  Signed, [name], MD  02/21/2020 3:52 PM     [place] [address] [phone] (office) [phone] (fax)

## 2020-02-21 NOTE — Telephone Encounter (Signed)
°  Patient Consent for Virtual Visit         Mariah Bryant has provided verbal consent on 02/21/2020 for a virtual visit (video or telephone).   CONSENT FOR VIRTUAL VISIT FOR:  Mariah Bryant  By participating in this virtual visit I agree to the following:  I hereby voluntarily request, consent and authorize [facility] and its employed or contracted physicians, physician assistants, nurse practitioners or other licensed health care professionals (the Practitioner), to provide me with telemedicine health care services (the Services") as deemed necessary by the treating Practitioner. I acknowledge and consent to receive the Services by the Practitioner via telemedicine. I understand that the telemedicine visit will involve communicating with the Practitioner through live audiovisual communication technology and the disclosure of certain medical information by electronic transmission. I acknowledge that I have been given the opportunity to request an in-person assessment or other available alternative prior to the telemedicine visit and am voluntarily participating in the telemedicine visit.  I understand that I have the right to withhold or withdraw my consent to the use of telemedicine in the course of my care at any time, without affecting my right to future care or treatment, and that the Practitioner or I may terminate the telemedicine visit at any time. I understand that I have the right to inspect all information obtained and/or recorded in the course of the telemedicine visit and may receive copies of available information for a reasonable fee.  I understand that some of the potential risks of receiving the Services via telemedicine include:   Delay or interruption in medical evaluation due to technological equipment failure or disruption;  Information transmitted may not be sufficient (e.g. poor resolution of images) to allow for appropriate medical decision making by the  Practitioner; and/or   In rare instances, security protocols could fail, causing a breach of personal health information.  Furthermore, I acknowledge that it is my responsibility to provide information about my medical history, conditions and care that is complete and accurate to the best of my ability. I acknowledge that Practitioner's advice, recommendations, and/or decision may be based on factors not within their control, such as incomplete or inaccurate data provided by me or distortions of diagnostic images or specimens that may result from electronic transmissions. I understand that the practice of medicine is not an exact science and that Practitioner makes no warranties or guarantees regarding treatment outcomes. I acknowledge that a copy of this consent can be made available to me via my patient portal ([facility]), or I can request a printed copy by calling the office of [facility].    I understand that my insurance will be billed for this visit.   I have read or had this consent read to me.  I understand the contents of this consent, which adequately explains the benefits and risks of the Services being provided via telemedicine.   I have been provided ample opportunity to ask questions regarding this consent and the Services and have had my questions answered to my satisfaction.  I give my informed consent for the services to be provided through the use of telemedicine in my medical care

## 2020-02-28 ENCOUNTER — Encounter: Payer: Medicare Other | Admitting: Occupational Therapy

## 2020-03-06 ENCOUNTER — Encounter: Payer: Medicare Other | Admitting: Occupational Therapy

## 2020-03-13 ENCOUNTER — Encounter: Payer: Medicare Other | Admitting: Occupational Therapy

## 2020-03-19 ENCOUNTER — Encounter: Payer: Self-pay | Admitting: Physician Assistant

## 2020-03-20 ENCOUNTER — Encounter: Payer: Medicare Other | Admitting: Occupational Therapy

## 2020-03-20 ENCOUNTER — Other Ambulatory Visit: Payer: Self-pay | Admitting: Physician Assistant

## 2020-03-20 MED ORDER — ESCITALOPRAM OXALATE 10 MG PO TABS: 15.0000 mg | ORAL_TABLET | Freq: Every day | ORAL | 1 refills | Status: DC

## 2020-04-11 ENCOUNTER — Other Ambulatory Visit: Payer: Self-pay | Admitting: *Deleted

## 2020-04-11 MED ORDER — APIXABAN 5 MG PO TABS
5.0000 mg | ORAL_TABLET | Freq: Two times a day (BID) | ORAL | 5 refills | Status: DC
Start: 2020-04-11 — End: 2020-10-15

## 2020-04-11 NOTE — Telephone Encounter (Signed)
Prescription refill request for Eliquis received from [facility].   Indication:Afib Last office visit: Mariah Bryant 02/21/2020 Scr: 0.80, 01/10/2020 Age: 87 yo  Weight: 65.3 kg   Prescription refill sent.

## 2020-04-28 NOTE — Progress Notes (Signed)
PCP:  Inda Coke, PA Primary Cardiologist: No primary care provider on file. Electrophysiologist: [name], MD   Mariah Bryant is a 87 y.o. female seen today for [name], MD for routine electrophysiology followup.  Since last being seen in our clinic the patient reports doing OK. She has had increased fatigue, but admits to being very sedentary since her hospitalization. She continues to have mild cardiac awareness and tachy-palpitations, primarily in the mornings. She has mild lightheadedness at times with rapid standing. No syncope. She and family are being very cautious in preventing falls.  [name] DATE TEST EF   3//14 Echo  60-65 %   6/17 Echo  70 % LA normal  1021 Echo 65%    Date Cr K TSH LFT Hgb  10/18 0.75 4.8   14.9  10/20 0.71 4.1   14.1  11/21 0.8 4.5 2.69 17 14.7     Past Medical History:  Diagnosis Date  . Allergic rhinitis 08/15/2017  . Asthma, stable, moderate persistent 07/17/2009  . Atrial fibrillation ([place])   . Brady-tachy syndrome ([place])    Per patient  . 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 Chronic pain of left knee 08/16/2017  . Depressive disorder 07/17/2009  . Gastro-esophageal reflux disease without esophagitis 10/07/2010  . Hiatal hernia 10/07/2010  . Hypercholesterolemia 07/17/2009  . Hypogammaglobulinemia ([place]) 08/15/2017  . Macular degeneration 08/16/2017  . Orthostatic intolerance 08/16/2017  . Osteoarthritis 07/17/2009  . Osteoporosis 08/16/2017  . Seborrheic dermatitis 10/07/2010  . Sleep apnea 07/17/2009  . Temporomandibular joint disorder 10/07/2010   Past Surgical History:  Procedure Laterality Date  . APPENDECTOMY    . CATARACT EXTRACTION, BILATERAL    . CHOLECYSTECTOMY    . HYSTERECTOMY ABDOMINAL WITH SALPINGO-OOPHORECTOMY    . REPLACEMENT TOTAL KNEE      Current Outpatient Medications  Medication Sig Dispense Refill  . albuterol (PROVENTIL HFA;VENTOLIN HFA) 108 (90 Base) MCG/ACT inhaler Inhale 2 puffs into the lungs every 4  (four) hours as needed for wheezing or shortness of breath.    . ALPRAZolam (XANAX) 0.25 MG tablet Take 1 tablet (0.25 mg total) by mouth 2 (two) times daily as needed for anxiety or sleep. (Patient taking differently: Take 0.25 mg by mouth at bedtime as needed for anxiety or sleep.) 30 tablet 3  . amiodarone (PACERONE) 200 MG tablet Take 1 tablet (200 mg total) by mouth daily. 90 tablet 3  . apixaban (ELIQUIS) 5 MG TABS tablet Take 1 tablet (5 mg total) by mouth 2 (two) times daily. 60 tablet 5  . budesonide (PULMICORT) 180 MCG/ACT inhaler Take 2 puffs by mouth 2 (two) times daily as needed.    . Cholecalciferol 25 MCG (1000 UT) tablet Take by mouth daily.    [name] denosumab (PROLIA) 60 MG/ML SOSY injection Inject 60 mg into the skin every 6 (six) months.    . escitalopram (LEXAPRO) 10 MG tablet Take 1.5 tablets (15 mg total) by mouth daily. 135 tablet 1  . fluticasone (FLONASE) 50 MCG/ACT nasal spray Place 2 sprays into both nostrils 2 (two) times daily as needed for allergies or rhinitis.    . furosemide (LASIX) 20 MG tablet Take 1 tablet (20 mg total) by mouth daily. 90 tablet 3  . Multiple Vitamins-Minerals (PRESERVISION AREDS 2 PO) Take 1 tablet by mouth 2 (two) times daily.     No current facility-administered medications for this visit.    Allergies  Allergen Reactions  . Amoxicillin Rash  . Epinephrine Palpitations  . Shellfish Allergy  Anaphylaxis  . Morphine And Related Other (See Comments)    Irregular heart beat  . Atarax [Hydroxyzine] Anxiety  . Biaxin [Clarithromycin] Rash  . Codeine Palpitations  . Doxycycline Rash  . Novocain [Procaine] Palpitations  . Penicillins Rash    Social History   Socioeconomic History  . Marital status: Widowed    Spouse name: Not on file  . Number of children: Not on file  . Years of education: Not on file  . Highest education level: Not on file  Occupational History  . Not on file  Tobacco Use  . Smoking status: Never Smoker  .  Smokeless tobacco: Never Used  Vaping Use  . Vaping Use: Never used  Substance and Sexual Activity  . Alcohol use: Yes    Alcohol/week: 1.0 standard drink    Types: 1 Glasses of wine per week    Comment: occasionally  . Drug use: Never  . Sexual activity: Not on file  Other Topics Concern  . Not on file  Social History Narrative   Lives with daughter and family. Has upstairs suite at the moment. They are working on getting her to the first floor.    Social Determinants of Health   Financial Resource Strain: Not on file  Food Insecurity: Not on file  Transportation Needs: Not on file  Physical Activity: Not on file  Stress: Not on file  Social Connections: Not on file  Intimate Partner Violence: Not on file     Review of Systems: General: No chills, fever, night sweats or weight changes  Cardiovascular:  No chest pain, dyspnea on exertion, edema, orthopnea, palpitations, paroxysmal nocturnal dyspnea Dermatological: No rash, lesions or masses Respiratory: No cough, dyspnea Urologic: No hematuria, dysuria Abdominal: No nausea, vomiting, diarrhea, bright red blood per rectum, melena, or hematemesis Neurologic: No visual changes, weakness, changes in mental status All other systems reviewed and are otherwise negative except as noted above.  Physical Exam: Vitals:   04/29/20 1131  BP: 110/68  Pulse: 76  SpO2: 96%  Weight: 142 lb (64.4 kg)  Height: 5' (1.524 m)    GEN- The patient is well appearing, alert and oriented x 3 today.   HEENT: normocephalic, atraumatic; sclera clear, conjunctiva pink; hearing intact; oropharynx clear; neck supple, no JVP Lymph- no cervical lymphadenopathy Lungs- Clear to ausculation bilaterally, normal work of breathing.  No wheezes, rales, rhonchi Heart- Regular rate and rhythm, no murmurs, rubs or gallops, PMI not laterally displaced GI- soft, non-tender, non-distended, bowel sounds present, no hepatosplenomegaly Extremities- no clubbing,  cyanosis, or edema; DP/PT/radial pulses 2+ bilaterally MS- no significant deformity or atrophy Skin- warm and dry, no rash or lesion Psych- euthymic mood, full affect Neuro- strength and sensation are intact  EKG is not ordered.   Additional studies reviewed include: EP notes  Assessment and Plan:  1. Paroxysmal atrial fibrillation Regular on exam today.  Continue amiodarone  Continue eliquis for CHA2DS2VASC of at least 5.    2. HFpEF Volume status stable on exam.  3. Orthostatic Hypotension Chronic, stable.  4. Deconditioning She has been very sedentary and worried about "doing too much" to the point she has limited herself in some ADLs.  Encouraged her to increase her activity, safely and as tolerated.   Long discussion today about prognosis, which she has been very anxious about. Reassurance given that she has no signs/symptoms or pathologies currently that make [place] concerned for any sort of worse prognosis. She did feel better by the end of our  visit today. RTC 3 months. Can stretch out further then if doing well.   Shirley Friar, PA-C  04/29/20 11:45 AM

## 2020-04-29 ENCOUNTER — Other Ambulatory Visit: Payer: Self-pay

## 2020-04-29 ENCOUNTER — Encounter: Payer: Self-pay | Admitting: Student

## 2020-04-29 ENCOUNTER — Ambulatory Visit (INDEPENDENT_AMBULATORY_CARE_PROVIDER_SITE_OTHER): Payer: Medicare Other | Admitting: Student

## 2020-04-29 VITALS — BP 110/68 | HR 76 | Ht 60.0 in | Wt 142.0 lb

## 2020-04-29 DIAGNOSIS — I480 Paroxysmal atrial fibrillation: Secondary | ICD-10-CM

## 2020-04-29 DIAGNOSIS — I951 Orthostatic hypotension: Secondary | ICD-10-CM | POA: Diagnosis not present

## 2020-04-29 DIAGNOSIS — I5032 Chronic diastolic (congestive) heart failure: Secondary | ICD-10-CM | POA: Diagnosis not present

## 2020-04-29 NOTE — Patient Instructions (Signed)
Medication Instructions:  Your physician recommends that you continue on your current medications as directed. Please refer to the Current Medication list given to you today.  *If you need a refill on your cardiac medications before your next appointment, please call your pharmacy*   Lab Work: TODAY: CMET, TSH, CBC  If you have labs (blood work) drawn today and your tests are completely normal, you will receive your results only by: [name] MyChart Message (if you have MyChart) OR . A paper copy in the mail If you have any lab test that is abnormal or we need to change your treatment, we will call you to review the results.   Follow-Up: At [facility], you and your health needs are our priority.  As part of our continuing mission to provide you with exceptional heart care, we have created designated Provider Care Teams.  These Care Teams include your primary Cardiologist (physician) and Advanced Practice Providers (APPs -  Physician Assistants and Nurse Practitioners) who all work together to provide you with the care you need, when you need it.  Your next appointment:   08/15/2020  The format for your next appointment:   In Person  Provider:   [name], MD

## 2020-04-30 LAB — COMPREHENSIVE METABOLIC PANEL
ALT: 13 IU/L (ref 0–32)
AST: 13 IU/L (ref 0–40)
Albumin/Globulin Ratio: 2.2 (ref 1.2–2.2)
Albumin: 4.3 g/dL (ref 3.6–4.6)
Alkaline Phosphatase: 85 IU/L (ref 44–121)
BUN/Creatinine Ratio: 14 (ref 12–28)
BUN: 13 mg/dL (ref 8–27)
Bilirubin Total: 0.4 mg/dL (ref 0.0–1.2)
CO2: 23 mmol/L (ref 20–29)
Calcium: 8.6 mg/dL — ABNORMAL LOW (ref 8.7–10.3)
Chloride: 100 mmol/L (ref 96–106)
Creatinine, Ser: 0.9 mg/dL (ref 0.57–1.00)
Globulin, Total: 2 g/dL (ref 1.5–4.5)
Glucose: 85 mg/dL (ref 65–99)
Potassium: 4.3 mmol/L (ref 3.5–5.2)
Sodium: 141 mmol/L (ref 134–144)
Total Protein: 6.3 g/dL (ref 6.0–8.5)
eGFR: 62 mL/min/{1.73_m2} (ref >59)

## 2020-04-30 LAB — CBC
Hematocrit: 44.7 % (ref 34.0–46.6)
Hemoglobin: 15 g/dL (ref 11.1–15.9)
MCH: 28.6 pg (ref 26.6–33.0)
MCHC: 33.6 g/dL (ref 31.5–35.7)
MCV: 85 fL (ref 79–97)
Platelets: 241 10*3/uL (ref 150–450)
RBC: 5.24 x10E6/uL (ref 3.77–5.28)
RDW: 14.3 % (ref 11.7–15.4)
WBC: 7.1 10*3/uL (ref 3.4–10.8)

## 2020-04-30 LAB — TSH: TSH: 2.56 u[IU]/mL (ref 0.450–4.500)

## 2020-05-02 DIAGNOSIS — H353221 Exudative age-related macular degeneration, left eye, with active choroidal neovascularization: Secondary | ICD-10-CM | POA: Diagnosis not present

## 2020-05-02 DIAGNOSIS — D3131 Benign neoplasm of right choroid: Secondary | ICD-10-CM | POA: Diagnosis not present

## 2020-05-02 DIAGNOSIS — H353112 Nonexudative age-related macular degeneration, right eye, intermediate dry stage: Secondary | ICD-10-CM | POA: Diagnosis not present

## 2020-05-02 DIAGNOSIS — H35371 Puckering of macula, right eye: Secondary | ICD-10-CM | POA: Diagnosis not present

## 2020-05-04 IMAGING — CT CT HEAD W/O CM
4 series · 17 of 47 positions shown, 19 images · non-contrast
Comparison: None.

CLINICAL DATA: Altered level of consciousness

EXAM:
CT HEAD WITHOUT CONTRAST
TECHNIQUE: Contiguous axial images were obtained from the base of the skull
through the vertex without intravenous contrast.

[Series 3: head without · axial · non-contrast · 0.41mm/px · z∈[-103,+2]mm · 7 of 29 slices shown, 9 images]
[im 4/29  brain]
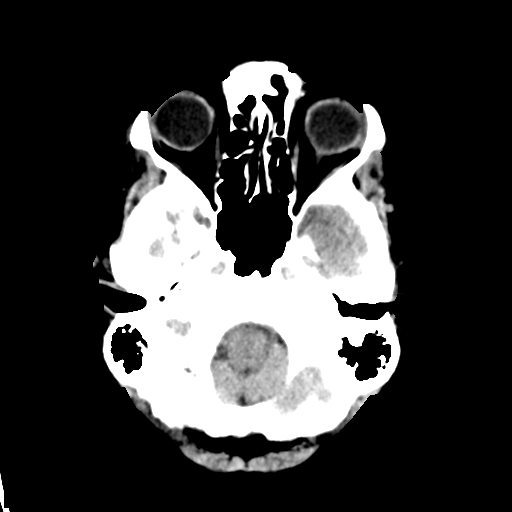
[im 4/29  bone]
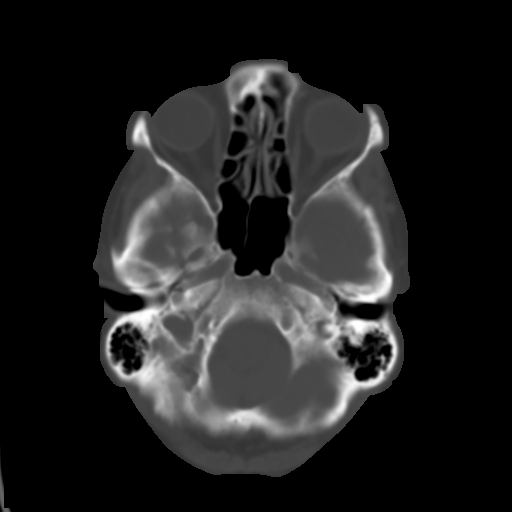
[im 8/29  brain]
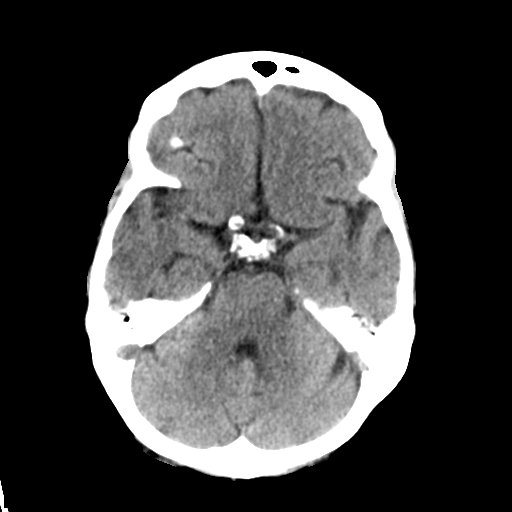
[im 11/29  brain]
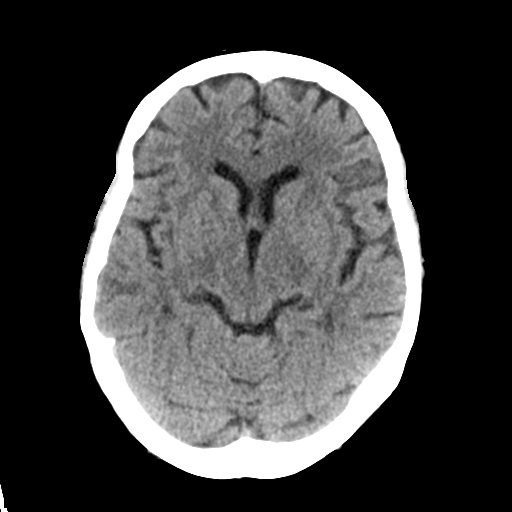
[im 15/29  brain]
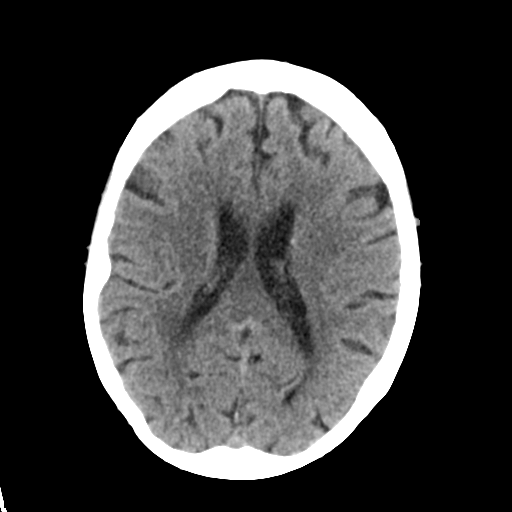
[im 18/29  brain]
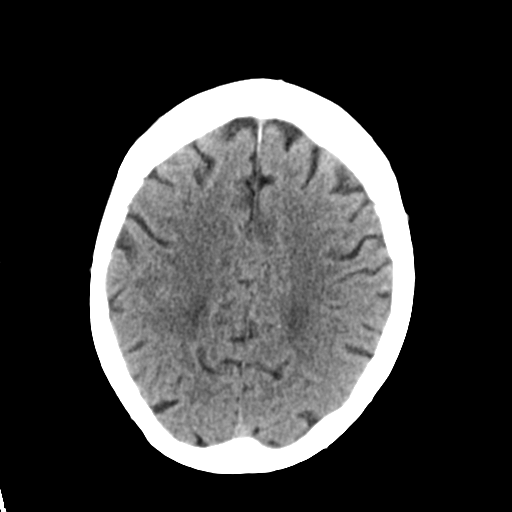
[im 18/29  bone]
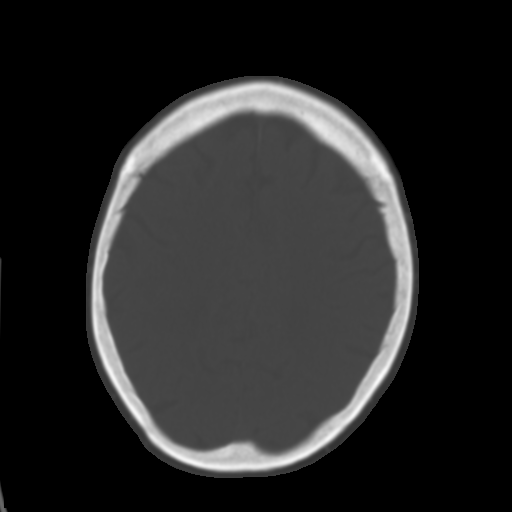
[im 22/29  brain]
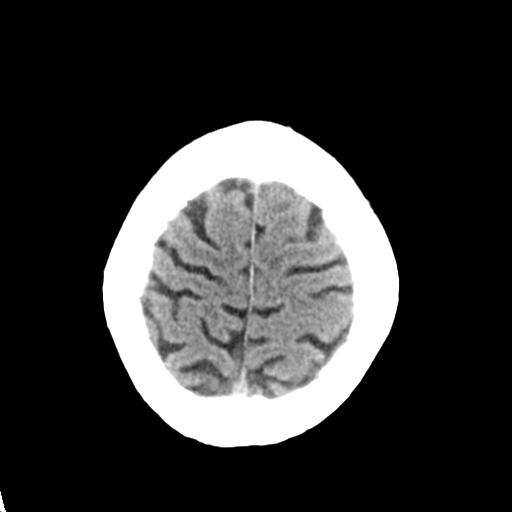
[im 25/29  brain]
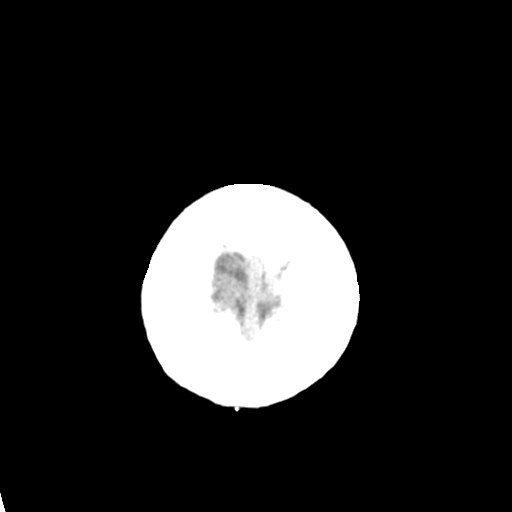

[Series 4: head bone · axial · 0.41mm/px · z∈[-104,-56]mm · 4 of 72 slices shown]
[im 8/72  bone]
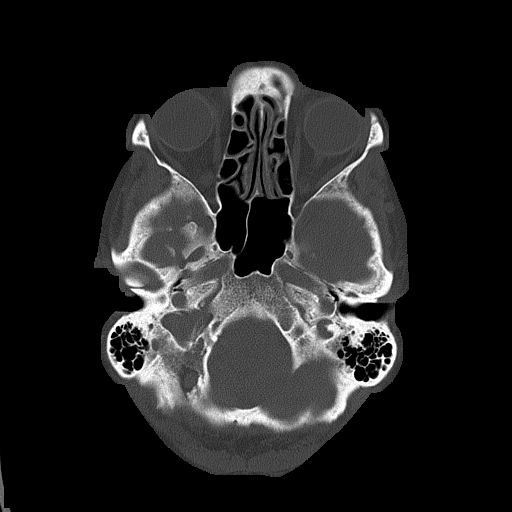
[im 15/72  bone]
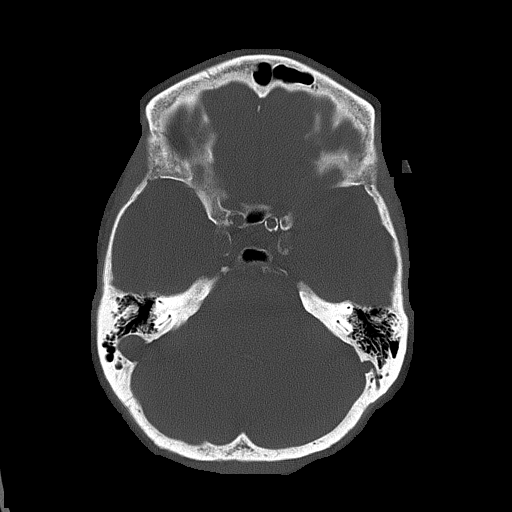
[im 22/72  bone]
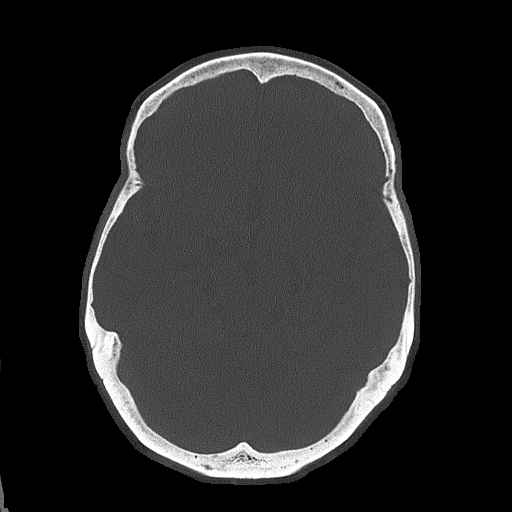
[im 32/72  bone]
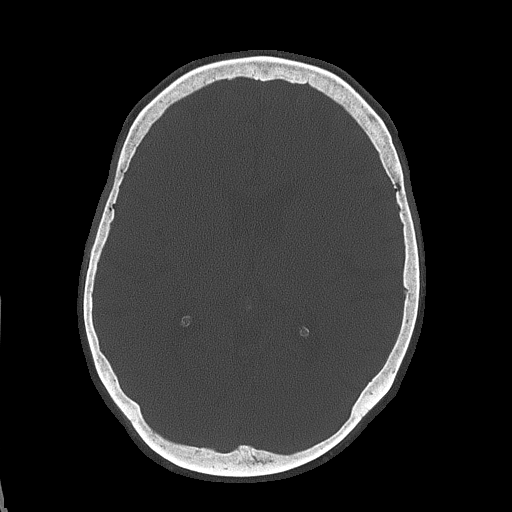

[Series 5: head without cor · coronal · non-contrast · 0.33mm/px · 3 of 67 slices shown]
[im 23/67  brain]
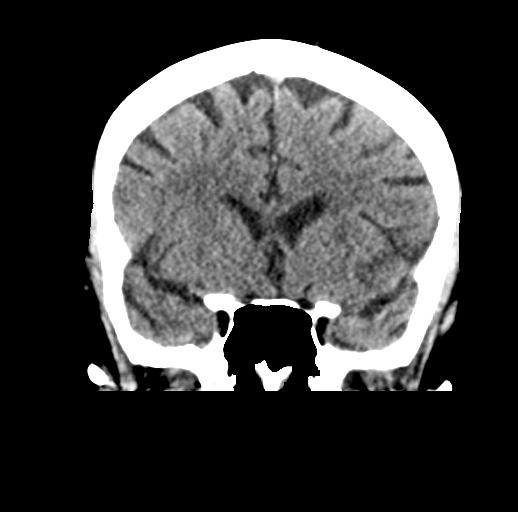
[im 30/67  brain]
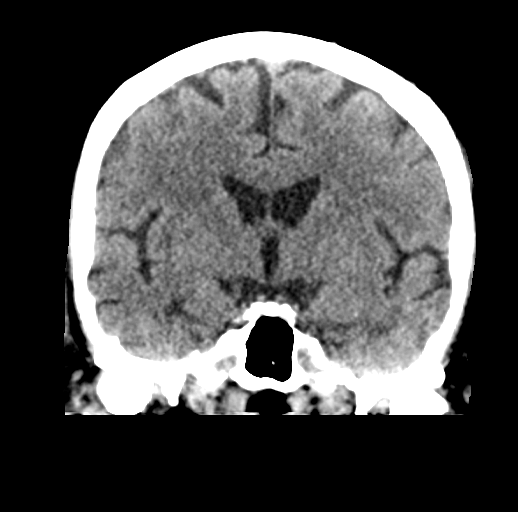
[im 37/67  brain]
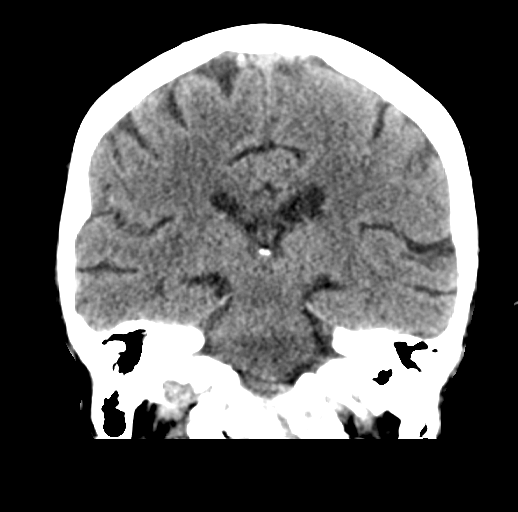

[Series 6: head without sag · sagittal · non-contrast · 0.36mm/px · 3 of 67 slices shown]
[im 23/67  brain]
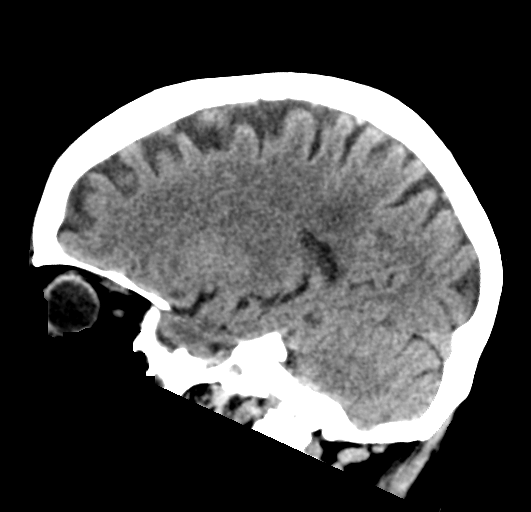
[im 34/67  brain]
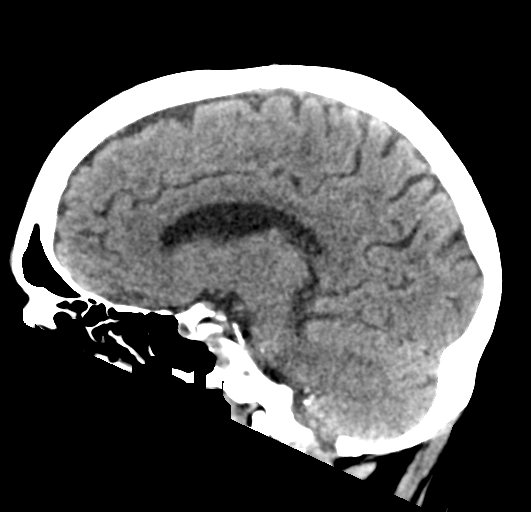
[im 45/67  brain]
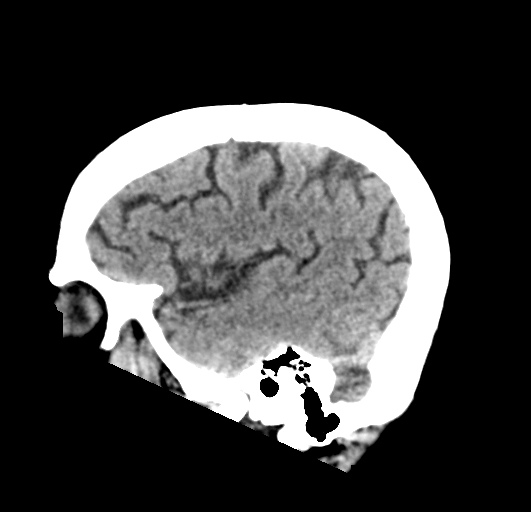

[17 of 47 positions shown; findings below may reference images not displayed]

FINDINGS: Brain: Ventricles and sulci are within normal limits for age. There
is no intracranial mass, hemorrhage, extra-axial fluid collection,
or midline shift. There is mild small vessel disease in the
posterosuperior centra semiovale bilaterally. No acute infarct is
evident on this study.

Vascular: No hyperdense vessel. There is calcification in each
carotid siphon region.

Skull: The bony calvarium appears intact. There is remodeling in the
region of the anterior arch of C1 and superior odontoid, findings
that potentially may represent residua of old trauma.

Sinuses/Orbits: There is mucosal thickening in multiple ethmoid air
cells. Other visualized paranasal sinuses are clear. Visualized
orbits appear symmetric bilaterally.

Other: Mastoid air cells are clear.
IMPRESSION: Mild periventricular small vessel disease. No acute infarct. No mass
or hemorrhage.

Foci of arterial vascular calcification. Mucosal thickening in
multiple ethmoid air cells.

Question residua of old trauma in the upper cervical region,
incompletely visualized. Bony calvarium appears intact.

## 2020-05-30 DIAGNOSIS — H35371 Puckering of macula, right eye: Secondary | ICD-10-CM | POA: Diagnosis not present

## 2020-05-30 DIAGNOSIS — H353112 Nonexudative age-related macular degeneration, right eye, intermediate dry stage: Secondary | ICD-10-CM | POA: Diagnosis not present

## 2020-05-30 DIAGNOSIS — H353221 Exudative age-related macular degeneration, left eye, with active choroidal neovascularization: Secondary | ICD-10-CM | POA: Diagnosis not present

## 2020-05-30 DIAGNOSIS — D3131 Benign neoplasm of right choroid: Secondary | ICD-10-CM | POA: Diagnosis not present

## 2020-06-18 DIAGNOSIS — H353221 Exudative age-related macular degeneration, left eye, with active choroidal neovascularization: Secondary | ICD-10-CM | POA: Diagnosis not present

## 2020-06-20 ENCOUNTER — Telehealth: Payer: Self-pay

## 2020-06-20 DIAGNOSIS — Z23 Encounter for immunization: Secondary | ICD-10-CM | POA: Diagnosis not present

## 2020-06-20 NOTE — Telephone Encounter (Signed)
Please schedule patient for prolia injection.  No PA required with current insurance policy

## 2020-06-23 NOTE — Telephone Encounter (Signed)
Patient is scheduled with Samantha on 07/07/20.

## 2020-07-07 ENCOUNTER — Ambulatory Visit: Payer: Medicare Other | Admitting: Physician Assistant

## 2020-07-14 DIAGNOSIS — H353112 Nonexudative age-related macular degeneration, right eye, intermediate dry stage: Secondary | ICD-10-CM | POA: Diagnosis not present

## 2020-07-14 DIAGNOSIS — H353221 Exudative age-related macular degeneration, left eye, with active choroidal neovascularization: Secondary | ICD-10-CM | POA: Diagnosis not present

## 2020-07-15 ENCOUNTER — Ambulatory Visit (INDEPENDENT_AMBULATORY_CARE_PROVIDER_SITE_OTHER): Payer: Medicare Other | Admitting: Family Medicine

## 2020-07-15 ENCOUNTER — Other Ambulatory Visit: Payer: Self-pay

## 2020-07-15 VITALS — BP 132/81 | HR 58 | Temp 98.0°F | Ht 60.0 in | Wt 141.4 lb

## 2020-07-15 DIAGNOSIS — L821 Other seborrheic keratosis: Secondary | ICD-10-CM

## 2020-07-15 NOTE — Patient Instructions (Signed)
It was very nice to see you today!  The spot on your back is a seborrheic keratosis.  This is benign.  Take care, Dr Jerline Pain  PLEASE NOTE:  If you had any lab tests please let us know if you have not heard back within a few days. You may see your results on mychart before we have a chance to review them but we will give you a call once they are reviewed by [place]. If we ordered any referrals today, please let us know if you have not heard from their office within the next week.   Please try these tips to maintain a healthy lifestyle:   Eat at least 3 REAL meals and 1-2 snacks per day.  Aim for no more than 5 hours between eating.  If you eat breakfast, please do so within one hour of getting up.    Each meal should contain half fruits/vegetables, one quarter protein, and one quarter carbs (no bigger than a computer mouse)   Cut down on sweet beverages. This includes juice, soda, and sweet tea.     Drink at least 1 glass of water with each meal and aim for at least 8 glasses per day   Exercise at least 150 minutes every week.   Seborrheic Keratosis A seborrheic keratosis is a common, noncancerous (benign) skin growth. These growths are velvety, waxy, rough, tan, brown, or black spots that appear on the skin. These skin growths can be flat or raised, and scaly. What are the causes? The cause of this condition is not known. What increases the risk? You are more likely to develop this condition if you: Have a family history of seborrheic keratosis. Are 50 or older. Are pregnant. Have had estrogen replacement therapy. What are the signs or symptoms? Symptoms of this condition include growths on the face, chest, shoulders, back, or other areas. These growths: Are usually painless, but may become irritated and itchy. Can be yellow, brown, black, or other colors. Are slightly raised or have a flat surface. Are sometimes rough or wart-like in texture. Are often velvety or waxy on the  surface. Are round or oval-shaped. Often occur in groups, but may occur as a single growth.   How is this diagnosed? This condition is diagnosed with a medical history and physical exam. A sample of the growth may be tested (skin biopsy). You may need to see a skin specialist (dermatologist). How is this treated? Treatment is not usually needed for this condition, unless the growths are irritated or bleed often. You may also choose to have the growths removed if you do not like their appearance. Most commonly, these growths are treated with a procedure in which liquid nitrogen is applied to "freeze" off the growth (cryosurgery). They may also be burned off with electricity (electrocautery) or removed by scraping (curettage). Follow these instructions at home: Watch your growth for any changes. Keep all follow-up visits as told by your health care provider. This is important. Do not scratch or pick at the growth or growths. This can cause them to become irritated or infected. Contact a health care provider if: You suddenly have many new growths. Your growth bleeds, itches, or hurts. Your growth suddenly becomes larger or changes color. Summary A seborrheic keratosis is a common, noncancerous (benign) skin growth. Treatment is not usually needed for this condition, unless the growths are irritated or bleed often. Watch your growth for any changes. Contact a health care provider if you suddenly have many  new growths or your growth suddenly becomes larger or changes color. Keep all follow-up visits as told by your health care provider. This is important. This information is not intended to replace advice given to you by your health care provider. Make sure you discuss any questions you have with your health care provider. Document Revised: 06/30/2017 Document Reviewed: 06/30/2017 Elsevier Patient Education  2021 [employer].

## 2020-07-15 NOTE — Progress Notes (Signed)
   Mariah Bryant is a 87 y.o. female who presents today for an office visit.  Assessment/Plan:  New/Acute Problems: Seborrheic keratosis Reassured patient.  Do not need to do any further intervention at this point.    Subjective:  HPI:  Patient here with concerning spot on her back.  She does not how long it has been there.  Her niece noticed it.  She would like to have it looked at today.  It has not changed in size or characteristic recently.       Objective:  Physical Exam: BP 132/81   Pulse (!) 58   Temp 98 F (36.7 C) (Temporal)   Ht 5' (1.524 m)   Wt 141 lb 6.4 oz (64.1 kg)   SpO2 98%   BMI 27.62 kg/m   Gen: No acute distress, resting comfortably Skin: Seborrheic keratosis on mid back. Psych: Normal affect and thought content      Glorimar Stroope M. Jerline Pain, MD 07/15/2020 10:13 AM

## 2020-07-21 ENCOUNTER — Other Ambulatory Visit: Payer: Self-pay | Admitting: Physician Assistant

## 2020-08-15 ENCOUNTER — Ambulatory Visit: Payer: Medicare Other | Admitting: Internal Medicine

## 2020-08-25 DIAGNOSIS — H353112 Nonexudative age-related macular degeneration, right eye, intermediate dry stage: Secondary | ICD-10-CM | POA: Diagnosis not present

## 2020-08-25 DIAGNOSIS — H353221 Exudative age-related macular degeneration, left eye, with active choroidal neovascularization: Secondary | ICD-10-CM | POA: Diagnosis not present

## 2020-08-26 ENCOUNTER — Telehealth (INDEPENDENT_AMBULATORY_CARE_PROVIDER_SITE_OTHER): Payer: Medicare Other | Admitting: Family Medicine

## 2020-08-26 ENCOUNTER — Encounter: Payer: Self-pay | Admitting: Family Medicine

## 2020-08-26 DIAGNOSIS — G250 Essential tremor: Secondary | ICD-10-CM | POA: Diagnosis not present

## 2020-08-26 MED ORDER — PRIMIDONE 50 MG PO TABS: 25.0000 mg | ORAL_TABLET | Freq: Every day | ORAL | 0 refills | Status: DC

## 2020-08-26 NOTE — Assessment & Plan Note (Signed)
Discussed treatment options.  Symptoms are not currently controlled.  She has had issues with beta-blockers in the past with dizziness.  We will start very low-dose primidone and she will check with me in a week or so.  We can titrate dose as needed.  If continues to have issues may consider referral to neurology.

## 2020-08-26 NOTE — Progress Notes (Signed)
   Mariah Bryant is a 87 y.o. female who presents today for a virtual office visit.  Assessment/Plan:  Chronic Problems Addressed Today: Essential tremor Discussed treatment options.  Symptoms are not currently controlled.  She has had issues with beta-blockers in the past with dizziness.  We will start very low-dose primidone and she will check with me in a week or so.  We can titrate dose as needed.  If continues to have issues may consider referral to neurology.     Subjective:  HPI:  Patient with longstanding history of essential tremor.  Predominantly previously located in bilateral hands.  Has strong family history with several family members with essential tremor as well.  Over the last few weeks has noticed more tremor in her jaw and lower lip.  This is a common problematic.  She has never been on treatment for this in the past.       Objective/Observations  Physical Exam: Gen: NAD, resting comfortably Pulm: Normal work of breathing Neuro: Grossly normal, moves all extremities.  Tremor noted in jaw. Psych: Normal affect and thought content  Virtual Visit via Video   I connected with Mariah Bryant on 08/26/20 at  3:40 PM EDT by a video enabled telemedicine application and verified that I am speaking with the correct person using two identifiers. The limitations of evaluation and management by telemedicine and the availability of in person appointments were discussed. The patient expressed understanding and agreed to proceed.   Patient location: Home Provider location: [place] participating in the virtual visit: Myself and Patient     Mariah Bryant. Mariah Pain, MD 08/26/2020 3:55 PM

## 2020-08-27 ENCOUNTER — Other Ambulatory Visit: Payer: Self-pay

## 2020-08-27 ENCOUNTER — Encounter: Payer: Self-pay | Admitting: *Deleted

## 2020-08-27 ENCOUNTER — Ambulatory Visit (INDEPENDENT_AMBULATORY_CARE_PROVIDER_SITE_OTHER): Payer: Medicare Other | Admitting: *Deleted

## 2020-08-27 DIAGNOSIS — M1710 Unilateral primary osteoarthritis, unspecified knee: Secondary | ICD-10-CM | POA: Diagnosis not present

## 2020-08-27 DIAGNOSIS — M810 Age-related osteoporosis without current pathological fracture: Secondary | ICD-10-CM

## 2020-08-27 MED ORDER — DENOSUMAB 60 MG/ML ~~LOC~~ SOSY
60.0000 mg | PREFILLED_SYRINGE | Freq: Once | SUBCUTANEOUS | Status: AC
  Administered 2020-08-27: 60 mg via SUBCUTANEOUS

## 2020-08-27 NOTE — Progress Notes (Signed)
Per orders of Dr. Jerline Pain, injection of Prolia 60 mg/ml given subcutaneous by Anselmo Pickler in left arm. Patient tolerated injection well. Patient will make appointment for 6 months

## 2020-08-29 ENCOUNTER — Encounter: Payer: Self-pay | Admitting: Family Medicine

## 2020-09-02 NOTE — Telephone Encounter (Signed)
Please see message. °

## 2020-09-18 ENCOUNTER — Other Ambulatory Visit: Payer: Self-pay | Admitting: Family Medicine

## 2020-10-06 DIAGNOSIS — H43821 Vitreomacular adhesion, right eye: Secondary | ICD-10-CM | POA: Diagnosis not present

## 2020-10-06 DIAGNOSIS — H353221 Exudative age-related macular degeneration, left eye, with active choroidal neovascularization: Secondary | ICD-10-CM | POA: Diagnosis not present

## 2020-10-06 DIAGNOSIS — H353112 Nonexudative age-related macular degeneration, right eye, intermediate dry stage: Secondary | ICD-10-CM | POA: Diagnosis not present

## 2020-10-06 DIAGNOSIS — D3131 Benign neoplasm of right choroid: Secondary | ICD-10-CM | POA: Diagnosis not present

## 2020-10-07 ENCOUNTER — Encounter: Payer: Self-pay | Admitting: Internal Medicine

## 2020-10-07 ENCOUNTER — Other Ambulatory Visit: Payer: Self-pay

## 2020-10-07 ENCOUNTER — Ambulatory Visit (INDEPENDENT_AMBULATORY_CARE_PROVIDER_SITE_OTHER): Payer: Medicare Other | Admitting: Internal Medicine

## 2020-10-07 VITALS — BP 118/68 | HR 67 | Ht 60.0 in | Wt 140.0 lb

## 2020-10-07 DIAGNOSIS — I480 Paroxysmal atrial fibrillation: Secondary | ICD-10-CM

## 2020-10-07 DIAGNOSIS — I509 Heart failure, unspecified: Secondary | ICD-10-CM | POA: Diagnosis not present

## 2020-10-07 DIAGNOSIS — I951 Orthostatic hypotension: Secondary | ICD-10-CM | POA: Diagnosis not present

## 2020-10-07 NOTE — Patient Instructions (Signed)

## 2020-10-07 NOTE — Progress Notes (Signed)
Patient ID: Mariah Bryant, female   DOB: [birth year], 87 y.o.   MRN: [id number]    Patient Care Team: Briscoe Deutscher, DO as PCP - General (Family Medicine) Deboraha Sprang, MD as Consulting Physician (Cardiology)   HPI  Mariah Bryant is a 87 y.o. female seen in followup for atrial fibrillation;  she is treated with a combination of apixaban and metoprolol.  She was diagnosed in [place].  Previous EP physician who recommended cryoablation for symptomatic palpitations which seemed primarily orthostatic.  She had other symptoms and objective evidence for orthostatic intolerance.  At the last visit we discussed the physiology of orthostatic intolerance and various nonpharmacological maneuvers.    .  Today, The patient denies chest pain, shortness of breath, nocturnal dyspnea, orthopnea There have been no palpitations, lightheadedness or syncope.    Complains of fatigue, and feeling sleepy. LE edema . Her palpitations have improved.  When waking up in the morning she gets a ringing noise in her ears and an awareness of pulse  Earlier this year she went on a trip with with one of her daughters and returned with a more prominent facial tremor; known long h of tremors . She notes she have not had a change in her medications   She will be going to [place] 10/18/20 DATE TEST EF%   3//14 Echo   60-65 %   6/17 Echo   70 % LA normal        Date Cr K Hgb  10/18 0.75 4.8 14.9  10/20  0.71 4.1 14.1   11/21 0.80 4.5 14.7  3/22 0.90 4.3 15.0   Also complaining of night sweats.  Has been on Lexapro for a couple of years.    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   Chronic pain of left knee 08/16/2017   Depressive disorder 07/17/2009   Gastro-esophageal reflux disease without esophagitis 10/07/2010    Hiatal hernia 10/07/2010   Hypercholesterolemia 07/17/2009   Hypogammaglobulinemia ([place]) 08/15/2017   Macular degeneration 08/16/2017   Orthostatic intolerance 08/16/2017   Osteoarthritis 07/17/2009   Osteoporosis 08/16/2017   Seborrheic dermatitis 10/07/2010   Sleep apnea 07/17/2009   Temporomandibular joint disorder 10/07/2010    Past Surgical History:  Procedure Laterality Date   APPENDECTOMY     CATARACT EXTRACTION, BILATERAL     CHOLECYSTECTOMY     HYSTERECTOMY ABDOMINAL WITH SALPINGO-OOPHORECTOMY     REPLACEMENT TOTAL KNEE      Current Meds  Medication Sig   albuterol (PROVENTIL HFA;VENTOLIN HFA) 108 (90 Base) MCG/ACT inhaler Inhale 2 puffs into the lungs every 4 (four) hours as needed for wheezing or shortness of breath.   ALPRAZolam (XANAX) 0.25 MG tablet TAKE 1 TABLET (0.25 MG TOTAL) BY MOUTH 2 (TWO) TIMES DAILY AS NEEDED FOR ANXIETY OR SLEEP.   amiodarone (PACERONE) 200 MG tablet Take 1 tablet (200 mg total) by mouth daily.   apixaban (ELIQUIS) 5 MG TABS tablet Take 1 tablet (5 mg total) by mouth 2 (two) times daily.   budesonide (PULMICORT) 180 MCG/ACT inhaler Take 2 puffs by mouth 2 (two) times daily as needed.   Cholecalciferol 25 MCG (1000 UT) tablet Take by mouth daily.   denosumab (PROLIA) 60 MG/ML SOSY injection Inject 60 mg into the skin every 6 (six) months.   escitalopram (LEXAPRO) 10 MG  tablet Take 1.5 tablets (15 mg total) by mouth daily.   fluticasone (FLONASE) 50 MCG/ACT nasal spray Place 2 sprays into both nostrils 2 (two) times daily as needed for allergies or rhinitis.   furosemide (LASIX) 20 MG tablet Take 1 tablet (20 mg total) by mouth daily.   Multiple Vitamins-Minerals (PRESERVISION AREDS 2 PO) Take 1 tablet by mouth 2 (two) times daily.   primidone (MYSOLINE) 50 MG tablet Take 0.5 tablets (25 mg total) by mouth at bedtime.  .  Allergies  Allergen Reactions   Amoxicillin Rash   Epinephrine Palpitations   Shellfish Allergy Anaphylaxis   Morphine And Related  Other (See Comments)    Irregular heart beat   Atarax [Hydroxyzine] Anxiety   Biaxin [Clarithromycin] Rash   Codeine Palpitations   Doxycycline Rash   Novocain [Procaine] Palpitations   Penicillins Rash      Review of Systems negative except from HPI and PMH  Physical Exam BP 118/68   Pulse 67   Ht 5' (1.524 m)   Wt 140 lb (63.5 kg)   SpO2 96%   BMI 27.34 kg/m  Well developed and well nourished in no acute distress HENT normal Neck supple with JVP-flat LungsClear Device pocket well healed; without hematoma or erythema.  There is no tethering  Regular rate and rhythm, no  gallop No murmur Abd-soft with active BS No Clubbing cyanosis  Mild edema Skin-warm and dry A & Oriented  Grossly normal sensory and motor function  ECG  sinus @ 67 18/07/43   Assessment and Plan:   Atrial fibrillation-paroxysmal  Fatigue   Orthostatic hypotension  Tremor    Still with some palpitations but overall much improved.  No orthostatic lightheadedness.  Most impressive of late has been is profound fatigue causing her to sleep hours at a time.  Relatively new in onset.  No new medications.  Not sure what this is.  Have suggested she follow-up with her PCP  Tremor worsening prompted the question as to whether we should try her on something different from Eliquis as I came across the patient today who had a tremor attributed to Eliquis which improved with its discontinuation. Discussed this wth the patient and her daughter and we will undertake trial of Rivaroxaban but will wait until she returns from new [place] as a Education administrator for [name], MD.,have documented all relevant documentation on the behalf of [name], MD,as directed by  [name], MD while in the presence of [name], MD.  I, [name], MD, have reviewed all documentation for this visit. The documentation on 10/07/20 for the exam, diagnosis, procedures, and orders are all accurate  and complete.

## 2020-10-08 ENCOUNTER — Other Ambulatory Visit: Payer: Self-pay | Admitting: Physician Assistant

## 2020-10-15 ENCOUNTER — Other Ambulatory Visit: Payer: Self-pay | Admitting: Internal Medicine

## 2020-10-15 NOTE — Telephone Encounter (Signed)
Prescription refill request for Eliquis received. Indication: Afib Last office visit: 10/07/20 Caryl Comes) Scr: 0.90 (04/29/20) Age: 87 Weight: 63.5kg  Appropriate dose and refill sent to requested pharmacy.

## 2020-10-17 ENCOUNTER — Other Ambulatory Visit: Payer: Self-pay | Admitting: Student

## 2020-11-30 ENCOUNTER — Other Ambulatory Visit: Payer: Self-pay | Admitting: Family Medicine

## 2020-12-05 ENCOUNTER — Other Ambulatory Visit: Payer: Self-pay

## 2020-12-05 ENCOUNTER — Encounter: Payer: Self-pay | Admitting: Physician Assistant

## 2020-12-05 MED ORDER — ALPRAZOLAM 0.25 MG PO TABS: 0.2500 mg | ORAL_TABLET | Freq: Two times a day (BID) | ORAL | 0 refills | Status: DC | PRN

## 2020-12-05 NOTE — Telephone Encounter (Signed)
Pt requesting refill of Alprazolam 0.25 mg sent to this pharmacy. Cart loaded.

## 2020-12-05 NOTE — Telephone Encounter (Signed)
LAST APPOINTMENT DATE:  08/26/20  NEXT APPOINTMENT DATE: None  MEDICATION:ALPRAZolam (XANAX) 0.25 MG tablet  Is the patient out of medication? Yes   PHARMACY: [id number] - [address] [place]

## 2020-12-05 NOTE — Telephone Encounter (Signed)
See other message

## 2020-12-17 ENCOUNTER — Telehealth: Payer: Self-pay

## 2020-12-17 ENCOUNTER — Other Ambulatory Visit: Payer: Self-pay | Admitting: Physician Assistant

## 2020-12-17 ENCOUNTER — Encounter: Payer: Self-pay | Admitting: Physician Assistant

## 2020-12-17 NOTE — Telephone Encounter (Signed)
LAST APPOINTMENT DATE:  10/07/20  NEXT APPOINTMENT DATE: None  MEDICATION:ALPRAZolam (XANAX) 0.25 MG tablet   PHARMACY: [id number] Royetta Car, MD - [address]

## 2020-12-19 MED ORDER — ALPRAZOLAM 0.25 MG PO TABS: 0.2500 mg | ORAL_TABLET | Freq: Two times a day (BID) | ORAL | 0 refills | Status: DC | PRN

## 2020-12-19 NOTE — Telephone Encounter (Signed)
See MyChart message

## 2020-12-19 NOTE — Telephone Encounter (Signed)
See message from daughter.  

## 2020-12-23 DIAGNOSIS — H353221 Exudative age-related macular degeneration, left eye, with active choroidal neovascularization: Secondary | ICD-10-CM | POA: Diagnosis not present

## 2020-12-23 DIAGNOSIS — H353112 Nonexudative age-related macular degeneration, right eye, intermediate dry stage: Secondary | ICD-10-CM | POA: Diagnosis not present

## 2020-12-26 ENCOUNTER — Other Ambulatory Visit: Payer: Self-pay | Admitting: Physician Assistant

## 2020-12-26 NOTE — Telephone Encounter (Signed)
Pt is back in town.

## 2021-01-11 ENCOUNTER — Other Ambulatory Visit: Payer: Self-pay | Admitting: Physician Assistant

## 2021-01-21 ENCOUNTER — Other Ambulatory Visit: Payer: Self-pay | Admitting: Internal Medicine

## 2021-01-21 DIAGNOSIS — Z23 Encounter for immunization: Secondary | ICD-10-CM | POA: Diagnosis not present

## 2021-01-26 NOTE — Telephone Encounter (Signed)
Prescription refill request for Eliquis received. Indication: afib  Last office visit: Caryl Comes, 10/07/2020 Scr: 0.9, 04/29/2020 Age: 87 yo  Weight: 63.2 kg   Refill sent.

## 2021-01-27 ENCOUNTER — Other Ambulatory Visit: Payer: Self-pay | Admitting: Physician Assistant

## 2021-01-27 ENCOUNTER — Telehealth: Payer: Self-pay

## 2021-01-27 NOTE — Telephone Encounter (Signed)
Patient's daughter is calling in stating she would like for her mom to receive a prolia injection, but is also curious to know if Gwenetta is due for her shingles vaccine.

## 2021-01-27 NOTE — Telephone Encounter (Signed)
Absolutely last refill until office visit -- needs appointment. Last seen for this in August 2021.

## 2021-01-27 NOTE — Telephone Encounter (Signed)
Mariah Bryant, pt has not had a Prolia injection since 07/2020 is due end of December. Okay for pt to schedule?

## 2021-01-27 NOTE — Telephone Encounter (Signed)
Left message on daughter Brock Ra voicemail to call office.

## 2021-01-28 ENCOUNTER — Emergency Department (HOSPITAL_BASED_OUTPATIENT_CLINIC_OR_DEPARTMENT_OTHER)
Admission: EM | Admit: 2021-01-28 | Discharge: 2021-01-28 | Disposition: A | Discharge status: Home / Self Care | Payer: Medicare Other | Attending: Emergency Medicine | Admitting: Emergency Medicine

## 2021-01-28 ENCOUNTER — Emergency Department (HOSPITAL_BASED_OUTPATIENT_CLINIC_OR_DEPARTMENT_OTHER): Payer: Medicare Other

## 2021-01-28 ENCOUNTER — Encounter (HOSPITAL_BASED_OUTPATIENT_CLINIC_OR_DEPARTMENT_OTHER): Payer: Self-pay | Admitting: Emergency Medicine

## 2021-01-28 ENCOUNTER — Telehealth: Payer: Self-pay

## 2021-01-28 ENCOUNTER — Other Ambulatory Visit: Payer: Self-pay

## 2021-01-28 DIAGNOSIS — S0990XA Unspecified injury of head, initial encounter: Secondary | ICD-10-CM | POA: Diagnosis not present

## 2021-01-28 DIAGNOSIS — S0003XA Contusion of scalp, initial encounter: Secondary | ICD-10-CM | POA: Diagnosis not present

## 2021-01-28 DIAGNOSIS — Z7951 Long term (current) use of inhaled steroids: Secondary | ICD-10-CM | POA: Insufficient documentation

## 2021-01-28 DIAGNOSIS — I4891 Unspecified atrial fibrillation: Secondary | ICD-10-CM | POA: Diagnosis not present

## 2021-01-28 DIAGNOSIS — S8011XA Contusion of right lower leg, initial encounter: Secondary | ICD-10-CM | POA: Diagnosis not present

## 2021-01-28 DIAGNOSIS — Z96651 Presence of right artificial knee joint: Secondary | ICD-10-CM | POA: Insufficient documentation

## 2021-01-28 DIAGNOSIS — W06XXXA Fall from bed, initial encounter: Secondary | ICD-10-CM | POA: Insufficient documentation

## 2021-01-28 DIAGNOSIS — S8991XA Unspecified injury of right lower leg, initial encounter: Secondary | ICD-10-CM | POA: Diagnosis present

## 2021-01-28 DIAGNOSIS — J45909 Unspecified asthma, uncomplicated: Secondary | ICD-10-CM | POA: Diagnosis not present

## 2021-01-28 DIAGNOSIS — I509 Heart failure, unspecified: Secondary | ICD-10-CM | POA: Diagnosis not present

## 2021-01-28 DIAGNOSIS — Z7901 Long term (current) use of anticoagulants: Secondary | ICD-10-CM | POA: Insufficient documentation

## 2021-01-28 NOTE — Discharge Instructions (Signed)
As we discussed your head CT does not show any bleeding inside the skull.  This does not guarantee that nothing will happen to you, if you have worsening headache confusion or vomiting then please have someone evaluate you in the office if not back in the emergency department.

## 2021-01-28 NOTE — Telephone Encounter (Signed)
Noted  

## 2021-01-28 NOTE — ED Triage Notes (Signed)
Reports she fell and hit the top of her head on her nightstand.  No LOC.  Also reports numbness went from the wound down to the left side of her face.  No neuro deficits noted.  Takes eliquis.

## 2021-01-28 NOTE — Telephone Encounter (Signed)
Patients daughter called in and stated the patient had bumped her head and is on apixaban (ELIQUIS) 5 MG TABS tablet. Patient wanted to know if she should take the patient to the ED went back and spoke to [facility] and she agreed with the patient going to the ED. Patients daughter stated she would take her the [facility] ED.

## 2021-01-28 NOTE — ED Provider Notes (Signed)
[facility] DEPT Provider Note   CSN: [id number] Arrival date & time: 01/28/21  1117     History Chief Complaint  Patient presents with   Mariah Bryant is a 87 y.o. female.  87 yo F with a chief complaint of a closed head injury.  The patient was trying to get something off of her nightstand and she fell and struck her head.  She denies loss of consciousness denies confusion denies vomiting.  Complaining of pain to the left frontal area and the left vertex of the head.  She has some mild pain to the right knee.  Otherwise denies any injury denies chest pain denies trouble breathing denies neck pain back pain.  The history is provided by the patient.  Fall This is a new problem. The current episode started 2 days ago. The problem occurs constantly. The problem has not changed since onset.Associated symptoms include headaches. Pertinent negatives include no chest pain and no shortness of breath. Nothing aggravates the symptoms. Nothing relieves the symptoms. She has tried nothing for the symptoms. The treatment provided no relief.      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   Chronic pain of left knee 08/16/2017   Depressive disorder 07/17/2009   Gastro-esophageal reflux disease without esophagitis 10/07/2010   Hiatal hernia 10/07/2010   Hypercholesterolemia 07/17/2009   Hypogammaglobulinemia ([place]) 08/15/2017   Macular degeneration 08/16/2017   Orthostatic intolerance 08/16/2017   Osteoarthritis 07/17/2009   Osteoporosis 08/16/2017   Seborrheic dermatitis 10/07/2010   Sleep apnea 07/17/2009   Temporomandibular joint disorder 10/07/2010    Patient Active Problem List   Diagnosis Date Noted   Essential tremor 08/26/2020   CHF (congestive heart failure) ([place]) 02/15/2020    Positive ANA (antinuclear antibody) 01/28/2020   RLS (restless legs syndrome) 08/27/2017   Chronic toe pain, right third toe 08/27/2017   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, mild stage 08/27/2017   Macular degeneration of both eyes 08/27/2017   Difficulty walking 08/27/2017   Hearing loss, with bilateral hearing aids obtained from [facility] 08/19/2017   Osteoporosis, on Prolia 08/16/2017   Chronic pain of left knee 08/16/2017   Chronic left-sided low back pain without sciatica 08/16/2017   Macular degeneration 08/16/2017   Orthostatic intolerance 08/16/2017   Aortic atherosclerosis ([place]) 08/16/2017   Degenerative arthritis of lumbar spine 08/16/2017   Allergic rhinitis, on Flonase prn 08/15/2017   PAF (paroxysmal atrial fibrillation) ([place]) 05/05/2017   Status post total right knee replacement 09/17/2015   Atrial paroxysmal tachycardia ([place]) 12/06/2012   Brown's syndrome, with double vision 10/07/2010   Hiatal hernia, without GERD 10/07/2010   Mitral valve prolapse 10/07/2010   Seborrheic dermatitis, controlled with Aloe 10/07/2010   Temporomandibular joint disorder, right side 10/07/2010   Mild persistent asthma, on daily Pulmicort 07/17/2009   Depressive disorder, on Lexapro 07/17/2009   Hypercholesterolemia, no statin 07/17/2009   Osteoarthritis 07/17/2009    Past Surgical History:  Procedure Laterality Date   APPENDECTOMY     CATARACT EXTRACTION, BILATERAL     CHOLECYSTECTOMY     HYSTERECTOMY ABDOMINAL WITH SALPINGO-OOPHORECTOMY     REPLACEMENT TOTAL KNEE       OB History   No obstetric history on file.     Family History  Problem  Relation Age of Onset   Stroke Mother    Arthritis Mother    Irregular heart beat Father    Cancer Sister    Cancer Brother    Congestive Heart Failure Paternal Grandmother     Social History   Tobacco Use   Smoking status: Never   Smokeless tobacco: Never  Vaping Use   Vaping Use: Never used   Substance Use Topics   Alcohol use: Yes    Alcohol/week: 1.0 standard drink    Types: 1 Glasses of wine per week    Comment: occasionally   Drug use: Never    Home Medications Prior to Admission medications   Medication Sig Start Date End Date Taking? Authorizing Provider  albuterol (PROVENTIL HFA;VENTOLIN HFA) 108 (90 Base) MCG/ACT inhaler Inhale 2 puffs into the lungs every 4 (four) hours as needed for wheezing or shortness of breath.   Yes [provider]  ALPRAZolam (XANAX) 0.25 MG tablet TAKE 1 TABLET (0.25 MG TOTAL) BY MOUTH 2 (TWO) TIMES DAILY AS NEEDED FOR ANXIETY OR SLEEP. 01/27/21  Yes Morene Rankins, Gough, PA  amiodarone (PACERONE) 200 MG tablet TAKE 1 TABLET BY MOUTH EVERY DAY 10/17/20  Yes Deboraha Sprang, MD  apixaban (ELIQUIS) 5 MG TABS tablet TAKE 1 TABLET BY MOUTH TWICE A DAY 01/26/21  Yes Deboraha Sprang, MD  budesonide (PULMICORT) 180 MCG/ACT inhaler Take 2 puffs by mouth 2 (two) times daily as needed. 08/02/12  Yes [provider]  Cholecalciferol 25 MCG (1000 UT) tablet Take by mouth daily.   Yes [provider]  denosumab (PROLIA) 60 MG/ML SOSY injection Inject 60 mg into the skin every 6 (six) months.   Yes [provider]  escitalopram (LEXAPRO) 10 MG tablet TAKE 1 AND 1/2 TABLETS BY MOUTH DAILY 01/12/21  Yes Inda Coke, PA  fluticasone (FLONASE) 50 MCG/ACT nasal spray Place 2 sprays into both nostrils 2 (two) times daily as needed for allergies or rhinitis.   Yes [provider]  furosemide (LASIX) 20 MG tablet Take 1 tablet (20 mg total) by mouth daily. 01/10/20  Yes Shirley Friar, PA-C  Multiple Vitamins-Minerals (PRESERVISION AREDS 2 PO) Take 1 tablet by mouth 2 (two) times daily.   Yes [provider]  primidone (MYSOLINE) 50 MG tablet Take 0.5 tablets (25 mg total) by mouth at bedtime. Patient not taking: Reported on 01/28/2021 08/26/20   Vivi Barrack, MD    Allergies    Amoxicillin,  Epinephrine, Shellfish allergy, Morphine and related, Atarax [hydroxyzine], Biaxin [clarithromycin], Codeine, Doxycycline, Novocain [procaine], and Penicillins  Review of Systems   Review of Systems  Constitutional:  Negative for chills and fever.  HENT:  Negative for congestion and rhinorrhea.   Eyes:  Negative for redness and visual disturbance.  Respiratory:  Negative for shortness of breath and wheezing.   Cardiovascular:  Negative for chest pain and palpitations.  Gastrointestinal:  Negative for nausea and vomiting.  Genitourinary:  Negative for dysuria and urgency.  Musculoskeletal:  Negative for arthralgias and myalgias.  Skin:  Negative for pallor and wound.  Neurological:  Positive for headaches. Negative for dizziness.   Physical Exam Updated Vital Signs BP (!) 144/65 (BP Location: Left Arm)   Pulse (!) 58   Temp 99 F (37.2 C)   Resp 12   Ht 5' (1.524 m)   Wt 63.5 kg   SpO2 100%   BMI 27.34 kg/m   Physical Exam Vitals and nursing note reviewed.  Constitutional:  General: She is not in acute distress.    Appearance: She is well-developed. She is not diaphoretic.  HENT:     Head: Normocephalic and atraumatic.  Eyes:     Pupils: Pupils are equal, round, and reactive to light.  Cardiovascular:     Rate and Rhythm: Normal rate and regular rhythm.     Heart sounds: No murmur heard.   No friction rub. No gallop.  Pulmonary:     Effort: Pulmonary effort is normal.     Breath sounds: No wheezing or rales.  Abdominal:     General: There is no distension.     Palpations: Abdomen is soft.     Tenderness: There is no abdominal tenderness.  Musculoskeletal:        General: No tenderness.     Cervical back: Normal range of motion and neck supple.     Comments: Small bruise to the proximal tibia on the right.  Full range of motion without significant pain.  Palpated from head to toe without any other noted areas of tenderness.  Skin:    General: Skin is warm and  dry.  Neurological:     Mental Status: She is alert and oriented to person, place, and time.  Psychiatric:        Behavior: Behavior normal.    ED Results / Procedures / Treatments   Labs (all labs ordered are listed, but only abnormal results are displayed) Labs Reviewed - No data to display  EKG None  Radiology CT Head Wo Contrast  Result Date: 01/28/2021 CLINICAL DATA:  Patient fell and hit top of head on nightstand. No loss of consciousness. Left-sided facial numbness. EXAM: CT HEAD WITHOUT CONTRAST TECHNIQUE: Contiguous axial images were obtained from the base of the skull through the vertex without intravenous contrast. COMPARISON:  CT head dated November 30, 2018 FINDINGS: Brain: No evidence of acute infarction, hemorrhage, hydrocephalus, extra-axial collection or mass lesion/mass effect. Scattered area of low attenuation in the periventricular white matter presumed chronic microvascular ischemic changes. Vascular: Atherosclerotic calcifications of bilateral carotid siphons and vertebral arteries. Skull: Small scalp hematoma in the superior left frontal bone without evidence of calvarial fracture. No focal osseous lesion. Sinuses/Orbits: Mucosal thickening of ethmoid air cells. Other: None. IMPRESSION: No acute intracranial hemorrhage. Small left vertex scalp hematoma without evidence of calvarial fracture. Electronically Signed   By: Keane Police D.O.   On: 01/28/2021 12:23    Procedures Procedures   Medications Ordered in ED Medications - No data to display  ED Course  I have reviewed the triage vital signs and the nursing notes.  Pertinent labs & imaging results that were available during my care of the patient were reviewed by me and considered in my medical decision making (see chart for details).    MDM Rules/Calculators/A&P                           87 yo F on Eliquis comes in with fall.  Struck her head on the bedside table.  CT of the head without intracranial  hemorrhage.  Patient with some mild bruising to the right knee but declining imaging.  We will have her follow-up with her family doctor in the office.  1:54 PM:  I have discussed the diagnosis/risks/treatment options with the patient and believe the pt to be eligible for discharge home to follow-up with PCP. We also discussed returning to the ED immediately if new or worsening sx  occur. We discussed the sx which are most concerning (e.g., sudden worsening pain, fever, inability to tolerate by mouth) that necessitate immediate return. Medications administered to the patient during their visit and any new prescriptions provided to the patient are listed below.  Medications given during this visit Medications - No data to display   The patient appears reasonably screen and/or stabilized for discharge and I doubt any other medical condition or other [facility] requiring further screening, evaluation, or treatment in the ED at this time prior to discharge.   Final Clinical Impression(s) / ED Diagnoses Final diagnoses:  Closed head injury, initial encounter  Fall from bed, initial encounter    Rx / DC Orders ED Discharge Orders     None        Deno Etienne, DO 01/28/21 1354

## 2021-01-30 DIAGNOSIS — Z20822 Contact with and (suspected) exposure to covid-19: Secondary | ICD-10-CM | POA: Diagnosis not present

## 2021-01-30 NOTE — Telephone Encounter (Signed)
Spoke with patient daughter notified her that Prolia is due 12/29 and will call with price

## 2021-02-10 ENCOUNTER — Telehealth: Payer: Self-pay

## 2021-02-10 NOTE — Telephone Encounter (Signed)
Patient needs to come in on Dec 29,22 no later than Dec 31,2022

## 2021-02-10 NOTE — Telephone Encounter (Signed)
Approved for Prolia  $0 Dollars  Patient needs to come in 12/29 no later that 12/31  Ready to schedule

## 2021-02-10 NOTE — Telephone Encounter (Signed)
Left pt vm to call back to schedule before end of year

## 2021-02-24 ENCOUNTER — Emergency Department (HOSPITAL_BASED_OUTPATIENT_CLINIC_OR_DEPARTMENT_OTHER): Payer: Medicare Other | Admitting: Radiology

## 2021-02-24 ENCOUNTER — Telehealth: Payer: Self-pay

## 2021-02-24 ENCOUNTER — Other Ambulatory Visit: Payer: Self-pay

## 2021-02-24 ENCOUNTER — Emergency Department (HOSPITAL_BASED_OUTPATIENT_CLINIC_OR_DEPARTMENT_OTHER)
Admission: EM | Admit: 2021-02-24 | Discharge: 2021-02-24 | Disposition: A | Discharge status: Home / Self Care | Payer: Medicare Other | Attending: Emergency Medicine | Admitting: Emergency Medicine

## 2021-02-24 ENCOUNTER — Encounter (HOSPITAL_BASED_OUTPATIENT_CLINIC_OR_DEPARTMENT_OTHER): Payer: Self-pay | Admitting: Emergency Medicine

## 2021-02-24 DIAGNOSIS — U071 COVID-19: Secondary | ICD-10-CM | POA: Diagnosis not present

## 2021-02-24 DIAGNOSIS — I509 Heart failure, unspecified: Secondary | ICD-10-CM | POA: Diagnosis not present

## 2021-02-24 DIAGNOSIS — R112 Nausea with vomiting, unspecified: Secondary | ICD-10-CM

## 2021-02-24 DIAGNOSIS — R509 Fever, unspecified: Secondary | ICD-10-CM | POA: Diagnosis not present

## 2021-02-24 DIAGNOSIS — Z7901 Long term (current) use of anticoagulants: Secondary | ICD-10-CM | POA: Diagnosis not present

## 2021-02-24 DIAGNOSIS — I4891 Unspecified atrial fibrillation: Secondary | ICD-10-CM | POA: Insufficient documentation

## 2021-02-24 DIAGNOSIS — J4540 Moderate persistent asthma, uncomplicated: Secondary | ICD-10-CM | POA: Insufficient documentation

## 2021-02-24 DIAGNOSIS — R059 Cough, unspecified: Secondary | ICD-10-CM | POA: Diagnosis not present

## 2021-02-24 LAB — CBC WITH DIFFERENTIAL/PLATELET
Abs Immature Granulocytes: 0.02 10*3/uL (ref 0.00–0.07)
Basophils Absolute: 0.1 10*3/uL (ref 0.0–0.1)
Basophils Relative: 1 %
Eosinophils Absolute: 0 10*3/uL (ref 0.0–0.5)
Eosinophils Relative: 0 %
HCT: 41.1 % (ref 36.0–46.0)
Hemoglobin: 13.6 g/dL (ref 12.0–15.0)
Immature Granulocytes: 0 %
Lymphocytes Relative: 8 %
Lymphs Abs: 0.7 10*3/uL (ref 0.7–4.0)
MCH: 29.3 pg (ref 26.0–34.0)
MCHC: 33.1 g/dL (ref 30.0–36.0)
MCV: 88.6 fL (ref 80.0–100.0)
Monocytes Absolute: 0.9 10*3/uL (ref 0.1–1.0)
Monocytes Relative: 9 %
Neutro Abs: 7.4 10*3/uL (ref 1.7–7.7)
Neutrophils Relative %: 82 %
Platelets: 178 10*3/uL (ref 150–400)
RBC: 4.64 MIL/uL (ref 3.87–5.11)
RDW: 14 % (ref 11.5–15.5)
WBC: 9.1 10*3/uL (ref 4.0–10.5)
nRBC: 0 % (ref 0.0–0.2)

## 2021-02-24 LAB — URINALYSIS, ROUTINE W REFLEX MICROSCOPIC
Bilirubin Urine: NEGATIVE
Glucose, UA: NEGATIVE mg/dL
Hgb urine dipstick: NEGATIVE
Ketones, ur: NEGATIVE mg/dL
Leukocytes,Ua: NEGATIVE
Nitrite: NEGATIVE
Specific Gravity, Urine: 1.018 (ref 1.005–1.030)
pH: 7 (ref 5.0–8.0)

## 2021-02-24 LAB — COMPREHENSIVE METABOLIC PANEL
ALT: 35 U/L (ref 0–44)
AST: 37 U/L (ref 15–41)
Albumin: 3.8 g/dL (ref 3.5–5.0)
Alkaline Phosphatase: 71 U/L (ref 38–126)
Anion gap: 9 (ref 5–15)
BUN: 14 mg/dL (ref 8–23)
CO2: 30 mmol/L (ref 22–32)
Calcium: 8.4 mg/dL — ABNORMAL LOW (ref 8.9–10.3)
Chloride: 99 mmol/L (ref 98–111)
Creatinine, Ser: 0.82 mg/dL (ref 0.44–1.00)
GFR, Estimated: >60 mL/min (ref >60)
Glucose, Bld: 110 mg/dL — ABNORMAL HIGH (ref 70–99)
Potassium: 3.6 mmol/L (ref 3.5–5.1)
Sodium: 138 mmol/L (ref 135–145)
Total Bilirubin: 0.6 mg/dL (ref 0.3–1.2)
Total Protein: 6.2 g/dL — ABNORMAL LOW (ref 6.5–8.1)

## 2021-02-24 LAB — RESP PANEL BY RT-PCR (FLU A&B, COVID) ARPGX2
Influenza A by PCR: NEGATIVE
Influenza B by PCR: NEGATIVE
SARS Coronavirus 2 by RT PCR: POSITIVE — AB

## 2021-02-24 MED ORDER — ONDANSETRON HCL 4 MG/2ML IJ SOLN
4.0000 mg | Freq: Once | INTRAMUSCULAR | Status: AC
  Administered 2021-02-24: 18:00:00 4 mg via INTRAVENOUS
  Filled 2021-02-24: qty 2

## 2021-02-24 MED ORDER — MOLNUPIRAVIR EUA 200MG CAPSULE: 4.0000 | ORAL_CAPSULE | Freq: Two times a day (BID) | ORAL | 0 refills | Status: AC

## 2021-02-24 MED ORDER — ACETAMINOPHEN 325 MG PO TABS
650.0000 mg | ORAL_TABLET | Freq: Once | ORAL | Status: AC
  Administered 2021-02-24: 19:00:00 650 mg via ORAL
  Filled 2021-02-24: qty 2

## 2021-02-24 MED ORDER — SODIUM CHLORIDE 0.9 % IV BOLUS
500.0000 mL | Freq: Once | INTRAVENOUS | Status: AC
  Administered 2021-02-24: 18:00:00 500 mL via INTRAVENOUS

## 2021-02-24 MED ORDER — ONDANSETRON 4 MG PO TBDP: 4.0000 mg | ORAL_TABLET | Freq: Three times a day (TID) | ORAL | 0 refills | Status: DC | PRN

## 2021-02-24 NOTE — ED Notes (Signed)
Patient transported to X-ray 

## 2021-02-24 NOTE — ED Triage Notes (Signed)
Fever , sore throat , emesis , multiple family members at home with similar symptoms. Negative covid test at home,  Productive cough.  Lethargy .  Epigastric pain

## 2021-02-24 NOTE — ED Notes (Signed)
Pt verbalizes understanding of discharge instructions. Opportunity for questioning and answers were provided. Pt discharged from ED to home with daughter.    

## 2021-02-24 NOTE — ED Provider Notes (Signed)
[place] EMERGENCY DEPT Provider Note   CSN: [id number] Arrival date & time: 02/24/21  1559    History Chief Complaint  Patient presents with   Fever    Mariah Bryant is a 87 y.o. female with past medical history significant for A. fib on chronic anticoagulation, CHF, essential tremor presents for evaluation of fever and feeling unwell.  Symptoms started on Sunday.  She had multiple family members that came in from out of town.  Multiple family members at home with similar symptoms.  Had negative COVID test at home.  Family states he been monitoring her oxygen her oxygen levels have been between 92 and 96% at home.  She has had multiple episodes of NBNB emesis.  Some generalized abdominal pain which resolved after emesis.  Denies additional aggravating or alleviating factors.  History obtained from patient and past medical records.  No interpreter used.  HPI     Past Medical History:  Diagnosis Date   Allergic rhinitis 08/15/2017   Asthma, stable, moderate persistent 07/17/2009   Atrial fibrillation (HCC)    Brady-tachy syndrome ([place])    Per patient   Brown's syndrome 10/07/2010   Chronic pain of left knee 08/16/2017   Depressive disorder 07/17/2009   Gastro-esophageal reflux disease without esophagitis 10/07/2010   Hiatal hernia 10/07/2010   Hypercholesterolemia 07/17/2009   Hypogammaglobulinemia ([place]) 08/15/2017   Macular degeneration 08/16/2017   Orthostatic intolerance 08/16/2017   Osteoarthritis 07/17/2009   Osteoporosis 08/16/2017   Seborrheic dermatitis 10/07/2010   Sleep apnea 07/17/2009   Temporomandibular joint disorder 10/07/2010    Patient Active Problem List   Diagnosis Date Noted   Essential tremor 08/26/2020   CHF (congestive heart failure) ([place]) 02/15/2020   Positive ANA (antinuclear antibody) 01/28/2020   RLS (restless legs syndrome) 08/27/2017   Chronic toe pain, right third toe 08/27/2017   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  (PXF) of lens, mild stage 08/27/2017   Macular degeneration of both eyes 08/27/2017   Difficulty walking 08/27/2017   Hearing loss, with bilateral hearing aids [facility] 08/19/2017   Osteoporosis, on Prolia 08/16/2017   Chronic pain of left knee 08/16/2017   Chronic left-sided low back pain without sciatica 08/16/2017   Macular degeneration 08/16/2017   Orthostatic intolerance 08/16/2017   Aortic atherosclerosis ([place]) 08/16/2017   Degenerative arthritis of lumbar spine 08/16/2017   Allergic rhinitis, on Flonase prn 08/15/2017   PAF (paroxysmal atrial fibrillation) ([place]) 05/05/2017   Status post total right knee replacement 09/17/2015   Atrial paroxysmal tachycardia ([place]) 12/06/2012   Brown's syndrome, with double vision 10/07/2010   Hiatal hernia, without GERD 10/07/2010   Mitral valve prolapse 10/07/2010   Seborrheic dermatitis, controlled with [place] 10/07/2010   Temporomandibular joint disorder, right side 10/07/2010   Mild persistent asthma, on daily Pulmicort 07/17/2009   Depressive disorder, on Lexapro 07/17/2009   Hypercholesterolemia, no statin 07/17/2009   Osteoarthritis 07/17/2009    Past Surgical History:  Procedure Laterality Date   APPENDECTOMY     CATARACT EXTRACTION, BILATERAL     CHOLECYSTECTOMY     HYSTERECTOMY ABDOMINAL WITH SALPINGO-OOPHORECTOMY     REPLACEMENT TOTAL KNEE       OB History   No obstetric history on file.     Family History  Problem Relation Age of Onset   Stroke Mother    Arthritis Mother    Irregular heart beat Father    Cancer Sister    Cancer Brother    Congestive Heart Failure Paternal Grandmother  Social History   Tobacco Use   Smoking status: Never   Smokeless tobacco: Never  Vaping Use   Vaping Use: Never used  Substance Use Topics   Alcohol use: Yes    Alcohol/week: 1.0 standard drink    Types: 1 Glasses of wine per week    Comment: occasionally   Drug use: Never    Home Medications Prior to  Admission medications   Medication Sig Start Date End Date Taking? Authorizing Provider  molnupiravir EUA (LAGEVRIO) 200 mg CAPS capsule Take 4 capsules (800 mg total) by mouth 2 (two) times daily for 5 days. 02/24/21 03/01/21 Yes Jonnatan Hanners A, PA-C  ondansetron (ZOFRAN-ODT) 4 MG disintegrating tablet Take 1 tablet (4 mg total) by mouth every 8 (eight) hours as needed for nausea or vomiting. 02/24/21  Yes Blaize Epple A, PA-C  albuterol (PROVENTIL HFA;VENTOLIN HFA) 108 (90 Base) MCG/ACT inhaler Inhale 2 puffs into the lungs every 4 (four) hours as needed for wheezing or shortness of breath.    [provider]  ALPRAZolam (XANAX) 0.25 MG tablet TAKE 1 TABLET (0.25 MG TOTAL) BY MOUTH 2 (TWO) TIMES DAILY AS NEEDED FOR ANXIETY OR SLEEP. 01/27/21   Inda Coke, PA  amiodarone (PACERONE) 200 MG tablet TAKE 1 TABLET BY MOUTH EVERY DAY 10/17/20   Deboraha Sprang, MD  apixaban (ELIQUIS) 5 MG TABS tablet TAKE 1 TABLET BY MOUTH TWICE A DAY 01/26/21   Deboraha Sprang, MD  budesonide (PULMICORT) 180 MCG/ACT inhaler Take 2 puffs by mouth 2 (two) times daily as needed. 08/02/12   [provider]  Cholecalciferol 25 MCG (1000 UT) tablet Take by mouth daily.    [provider]  denosumab (PROLIA) 60 MG/ML SOSY injection Inject 60 mg into the skin every 6 (six) months.    [provider]  escitalopram (LEXAPRO) 10 MG tablet TAKE 1 AND 1/2 TABLETS BY MOUTH DAILY 01/12/21   Inda Coke, PA  fluticasone [facility]) 50 MCG/ACT nasal spray Place 2 sprays into both nostrils 2 (two) times daily as needed for allergies or rhinitis.    [provider]  furosemide (LASIX) 20 MG tablet Take 1 tablet (20 mg total) by mouth daily. 01/10/20   Shirley Friar, PA-C  Multiple Vitamins-Minerals (PRESERVISION AREDS 2 PO) Take 1 tablet by mouth 2 (two) times daily.    [provider]  primidone (MYSOLINE) 50 MG tablet Take 0.5 tablets (25 mg total) by mouth at  bedtime. Patient not taking: Reported on 01/28/2021 08/26/20   Vivi Barrack, MD    Allergies    Amoxicillin, Epinephrine, Shellfish allergy, Morphine and related, Atarax [hydroxyzine], Biaxin [clarithromycin], Codeine, Doxycycline, Novocain [procaine], and Penicillins  Review of Systems   Review of Systems  Constitutional:  Positive for fatigue and fever.  HENT:  Positive for congestion, rhinorrhea, sneezing and sore throat. Negative for trouble swallowing and voice change.   Respiratory:  Positive for cough. Negative for apnea, choking, chest tightness, shortness of breath, wheezing and stridor.   Cardiovascular: Negative.   Gastrointestinal:  Positive for abdominal pain (resolved), nausea and vomiting. Negative for anal bleeding, blood in stool, constipation, diarrhea and rectal pain.  Genitourinary: Negative.   Musculoskeletal:  Positive for myalgias. Negative for neck pain and neck stiffness.  Skin: Negative.   Neurological:  Positive for weakness (generalized) and headaches. Negative for dizziness, tremors, seizures, syncope, facial asymmetry, speech difficulty, light-headedness and numbness.  All other systems reviewed and are negative.  Physical Exam Updated Vital Signs BP  116/87    Pulse 79    Temp 98.8 F (37.1 C)    Resp (!) 25    Wt 61.2 kg    SpO2 99%    BMI 26.37 kg/m   Physical Exam Vitals and nursing note reviewed.  Constitutional:      General: She is not in acute distress.    Appearance: She is well-developed. She is not ill-appearing, toxic-appearing or diaphoretic.  HENT:     Head: Normocephalic and atraumatic.     Nose: Nose normal.     Mouth/Throat:     Mouth: Mucous membranes are dry.  Eyes:     Pupils: Pupils are equal, round, and reactive to light.  Cardiovascular:     Rate and Rhythm: Normal rate.     Pulses: Normal pulses.          Radial pulses are 2+ on the right side and 2+ on the left side.       Dorsalis pedis pulses are 2+ on the right side  and 2+ on the left side.     Heart sounds: Normal heart sounds.  Pulmonary:     Effort: Pulmonary effort is normal. No respiratory distress.     Breath sounds: Normal breath sounds.     Comments: Clear bilaterally, speaks in full sentences without difficulty Abdominal:     General: Bowel sounds are normal. There is no distension.     Tenderness: There is no abdominal tenderness. There is no right CVA tenderness, left CVA tenderness, guarding or rebound.  Musculoskeletal:        General: No swelling, tenderness, deformity or signs of injury. Normal range of motion.     Cervical back: Normal range of motion.     Right lower leg: No edema.     Left lower leg: No edema.     Comments: No bony tenderness, compartments soft.  No lower extremity  Skin:    General: Skin is warm and dry.     Capillary Refill: Capillary refill takes less than 2 seconds.  Neurological:     General: No focal deficit present.     Mental Status: She is alert and oriented to person, place, and time.  Psychiatric:        Mood and Affect: Mood normal.    ED Results / Procedures / Treatments   Labs (all labs ordered are listed, but only abnormal results are displayed) Labs Reviewed  RESP PANEL BY RT-PCR (FLU A&B, COVID) ARPGX2 - Abnormal; Notable for the following components:      Result Value   SARS Coronavirus 2 by RT PCR POSITIVE (*)    All other components within normal limits  COMPREHENSIVE METABOLIC PANEL - Abnormal; Notable for the following components:   Glucose, Bld 110 (*)    Calcium 8.4 (*)    Total Protein 6.2 (*)    All other components within normal limits  URINALYSIS, ROUTINE W REFLEX MICROSCOPIC - Abnormal; Notable for the following components:   Protein, ur TRACE (*)    All other components within normal limits  CBC WITH DIFFERENTIAL/PLATELET    EKG EKG Interpretation  Date/Time:  Tuesday February 24 2021 16:30:54 EST Ventricular Rate:  88 PR Interval:  168 QRS Duration: 72 QT  Interval:  380 QTC Calculation: 459 R Axis:   34 Text Interpretation: Normal sinus rhythm T wave abnormality, consider inferior ischemia Abnormal ECG Confirmed by Wynona Dove ([id number]) on 02/24/2021 8:46:00 PM  Radiology DG Chest 2 View  Result  Date: 02/24/2021 CLINICAL DATA:  Cough, fever. EXAM: CHEST - 2 VIEW COMPARISON:  None. FINDINGS: The heart size and mediastinal contours are within normal limits. Both lungs are clear. The visualized skeletal structures are unremarkable. IMPRESSION: No active cardiopulmonary disease. Electronically Signed   By: Marijo Conception M.D.   On: 02/24/2021 17:07    Procedures Procedures   Medications Ordered in ED Medications  sodium chloride 0.9 % bolus 500 mL (500 mLs Intravenous New Bag/Given 02/24/21 1821)  ondansetron (ZOFRAN) injection 4 mg (4 mg Intravenous Given 02/24/21 1821)  acetaminophen (TYLENOL) tablet 650 mg (650 mg Oral Given 02/24/21 1914)   ED Course  I have reviewed the triage vital signs and the nursing notes.  Pertinent labs & imaging results that were available during my care of the patient were reviewed by me and considered in my medical decision making (see chart for details).  Here for evaluation of fever with multiple sick family members at home after a large family gathering.  She is afebrile, nonseptic, not ill-appearing.  Appears clinically dehydrated with multiple episodes of NBNB emesis at home.  Heart and lungs clear but abdomen soft, nontender.  History of CHF however does not clinically appear fluid overloaded.  She is chronically anticoagulated has not missed any doses.  No clinical evidence of VTE on exam  Labs and imaging personally reviewed and interpreted:  CBC without leukocytosis  Metabolic panel without significant electrolyte, renal or liver abnormality DG chest without cardiomegaly, pulm edema, pneumothorax, infiltrate EKG without acute abnormality COVID-positive UA negative  Patient reassessed.  Ambulatory  without any hypoxia, tolerating p.o. intake, feels improved after IV fluid  Symptoms likely due to COVID infection, DC home with symptomatic management, COVID antiviral medication.  Family does have pulse oximeter at home, will keep close eye on patient, will return for new or worsening symptoms for  The patient has been appropriately medically screened and/or stabilized in the ED. I have low suspicion for any other emergent medical condition which would require further screening, evaluation or treatment in the ED or require inpatient management.  Patient is hemodynamically stable and in no acute distress.  Patient able to ambulate in department prior to ED.  Evaluation does not show acute pathology that would require ongoing or additional emergent interventions while in the emergency department or further inpatient treatment.  I have discussed the diagnosis with the patient and answered all questions.  Pain is been managed while in the emergency department and patient has no further complaints prior to discharge.  Patient is comfortable with plan discussed in room and is stable for discharge at this time.  I have discussed strict return precautions for returning to the emergency department.  Patient was encouraged to follow-up with PCP/specialist refer to at discharge.     MDM Rules/Calculators/A&P                             Final Clinical Impression(s) / ED Diagnoses Final diagnoses:  COVID  Nausea and vomiting, unspecified vomiting type    Rx / DC Orders ED Discharge Orders          Ordered    molnupiravir EUA (LAGEVRIO) 200 mg CAPS capsule  2 times daily        02/24/21 2030    ondansetron (ZOFRAN-ODT) 4 MG disintegrating tablet  Every 8 hours PRN        02/24/21 2031  Kayly Kriegel A, PA-C 02/24/21 2052    Wynona Dove A, DO 02/25/21 [phone]

## 2021-02-24 NOTE — Telephone Encounter (Signed)
Pt in ED, see Triage note.

## 2021-02-24 NOTE — Discharge Instructions (Addendum)
Keep a close eye on your symptoms at home.  Take Zofran as needed for nausea and vomiting  Take the Molnupiravir COVID medication  Return for new or worsening symptoms such as persistent vomiting, oxygen less than 90%, severe weakness

## 2021-02-24 NOTE — Telephone Encounter (Signed)
Vomiting Reason for Call Symptomatic / Request for [place] states her mom has a fever of 100.4 and chills. She states she vomited. [place] Not Listed [address] ED Translation No Nurse Assessment Nurse: Rolin Barry, RN, Levada Dy Date/Time Eilene Ghazi Time): 02/24/2021 3:06:33 PM Confirm and document reason for call. If symptomatic, describe symptoms. ---Caller states her mom has a fever of 100.4 and chills. She states she vomited x1. Sx started Christmas Eve. Covid test last night negative. Highest temp 101. Call was dropped. O2 sat 94%. Does the patient have any new or worsening symptoms? ---Yes Will a triage be completed? ---Yes Related visit to physician within the last 2 weeks? ---No Does the PT have any chronic conditions? (i.e. diabetes, asthma, this includes High risk factors for pregnancy, etc.) ---Yes List chronic conditions. ---CHF Tachycardia Is this a behavioral health or substance abuse call? ---No Guidelines Guideline Title Affirmed Question Affirmed Notes Nurse Date/Time (Eastern Time) Cough - Acute NonProductive Chest pain (Exception: MILD central chest pain, present only when coughing) Deaton, RN, Levada Dy 02/24/2021 3:18:29 PM PLEASE NOTE: All timestamps contained within this report are represented as [place] Standard Time. CONFIDENTIALTY NOTICE: This fax transmission is intended only for the addressee. It contains information that is legally privileged, confidential or otherwise protected from use or disclosure. If you are not the intended recipient, you are strictly prohibited from reviewing, disclosing, copying using or disseminating any of this information or taking any action in reliance on or regarding this information. If you have received this fax in error, please notify us immediately by telephone so that we can arrange for its return to [place]. Phone: [phone], Toll-Free: [phone], Fax: [phone] Page: 2 of  2 Call Id: [id number] [place]. Time Eilene Ghazi Time) Disposition Final User 02/24/2021 3:09:44 PM Attempt made - no message left Deaton, RN, Levada Dy 02/24/2021 3:24:01 PM Go to ED Now Yes Deaton, RN, Cindee Lame Disagree/Comply Comply Caller Understands Yes PreDisposition Did not know what to do Care Advice Given Per Guideline GO TO ED NOW: * You need to be seen in the Emergency Department. * Go to the ED at ___________ [place] now. Drive carefully. ANOTHER ADULT SHOULD DRIVE: * It is better and safer if another adult drives instead of you. CALL EMS 911 IF: * Severe difficulty breathing occurs * Lips or face turns blue * Confusion occurs. CARE ADVICE given per Cough - Acute Non-Productive (Adult) guideline. Comments User: Saverio Danker, RN Date/Time Eilene Ghazi Time): 02/24/2021 3:17:42 PM A Fib, is on amiodarone. User: Saverio Danker, RN Date/Time Eilene Ghazi Time): 02/24/2021 3:25:06 PM Caller advised that the entire family is sick. Neg Covid test. Referrals GO TO FACILITY OTHER - [facility]

## 2021-03-06 ENCOUNTER — Encounter: Payer: Self-pay | Admitting: Family

## 2021-03-06 ENCOUNTER — Ambulatory Visit (INDEPENDENT_AMBULATORY_CARE_PROVIDER_SITE_OTHER): Payer: Medicare Other | Admitting: Family

## 2021-03-06 ENCOUNTER — Other Ambulatory Visit: Payer: Self-pay

## 2021-03-06 VITALS — BP 119/67 | HR 71 | Temp 98.2°F | Ht 60.0 in | Wt 139.2 lb

## 2021-03-06 DIAGNOSIS — U099 Post covid-19 condition, unspecified: Secondary | ICD-10-CM

## 2021-03-06 DIAGNOSIS — R053 Chronic cough: Secondary | ICD-10-CM

## 2021-03-06 MED ORDER — PREDNISONE 20 MG PO TABS: ORAL_TABLET | ORAL | 0 refills | Status: DC

## 2021-03-06 MED ORDER — ALBUTEROL SULFATE HFA 108 (90 BASE) MCG/ACT IN AERS: 2.0000 | INHALATION_SPRAY | Freq: Four times a day (QID) | RESPIRATORY_TRACT | 0 refills | Status: AC | PRN

## 2021-03-06 NOTE — Patient Instructions (Addendum)
It was very nice to see you today!  I have sent a refill for your Albuterol inhaler and a prescription for Prednisone to help you get over your cough. Start the inhaler today and the prednisone in the morning.  Drink at least 2 liters of water daily!    PLEASE NOTE:  If you had any lab tests please let us know if you have not heard back within a few days. You may see your results on MyChart before we have a chance to review them but we will give you a call once they are reviewed by [place]. If we ordered any referrals today, please let us know if you have not heard from their office within the next week.   Please try these tips to maintain a healthy lifestyle:  Eat most of your calories during the day when you are active. Eliminate processed foods including packaged sweets (pies, cakes, cookies), reduce intake of potatoes, white bread, white pasta, and white rice. Look for whole grain options, oat flour or almond flour.  Each meal should contain half fruits/vegetables, one quarter protein, and one quarter carbs (no bigger than a computer mouse).  Cut down on sweet beverages. This includes juice, soda, and sweet tea. Also watch fruit intake, though this is a healthier sweet option, it still contains natural sugar! Limit to 3 servings daily.  Drink at least 1 glass of water with each meal and aim for at least 8 glasses per day  Exercise at least 150 minutes every week.

## 2021-03-06 NOTE — Assessment & Plan Note (Signed)
finished the Molnupiravir, but still having persistent productive cough, reports hx of PNA x 5, last time a year ago. lungs sound wnl. Sending pred pack, and refilling inhaler, advised pt and dtr (present for visit) on use & SE. Drink 2 liters of water daily.

## 2021-03-06 NOTE — Progress Notes (Signed)
Subjective:     Patient ID: Mariah Bryant, female    DOB: Nov 02, 1930, 88 y.o.   MRN: [id number]  Chief Complaint  Patient presents with   Cough    Diagnosed with Covid 02/24/2021. Was given Molnupiravir.   Nasal Congestion   Fatigue    HPI: Cough: Patient complains of nasal congestion, productive cough, and fatigue .  Symptoms began 10 days ago.  Pt reports having Covid-19 and took the full course of Molnupiravir, and started to feel better, but her cough returned worse than before. The cough is productive of clear- yellow sputum, with wheezing, with shortness of breath, with shortness of breath during the cough, worsening over time and is aggravated by cold air and infection Associated symptoms include:change in voice. Patient does have a history of asthma. Patient does not have a history of environmental allergens. Patient has not had recent travel. Patient does not have a history of smoking. Patient  has not had a recent Chest X-ray.   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/2010   Chronic pain of left knee 08/16/2017   Depressive disorder 07/17/2009   Gastro-esophageal reflux disease without esophagitis 10/07/2010   Hiatal hernia 10/07/2010   Hypercholesterolemia 07/17/2009   Hypogammaglobulinemia ([place]) 08/15/2017   Macular degeneration 08/16/2017   Orthostatic intolerance 08/16/2017   Osteoarthritis 07/17/2009   Osteoporosis 08/16/2017   Seborrheic dermatitis 10/07/2010   Sleep apnea 07/17/2009   Temporomandibular joint disorder 10/07/2010    Past Surgical History:  Procedure Laterality  Date   APPENDECTOMY     CATARACT EXTRACTION, BILATERAL     CHOLECYSTECTOMY     HYSTERECTOMY ABDOMINAL WITH SALPINGO-OOPHORECTOMY     REPLACEMENT TOTAL KNEE      Outpatient Medications Prior to Visit  Medication Sig Dispense Refill   ALPRAZolam (XANAX) 0.25 MG tablet TAKE 1 TABLET (0.25 MG TOTAL) BY MOUTH 2 (TWO) TIMES DAILY AS NEEDED FOR ANXIETY OR SLEEP. 30 tablet 0   amiodarone (PACERONE) 200 MG tablet TAKE 1 TABLET BY MOUTH EVERY DAY 90 tablet 3   apixaban (ELIQUIS) 5 MG TABS tablet TAKE 1 TABLET BY MOUTH TWICE A DAY 60 tablet 5   budesonide (PULMICORT) 180 MCG/ACT inhaler Take 2 puffs by mouth 2 (two) times daily as needed.     Cholecalciferol 25 MCG (1000 UT) tablet Take by mouth daily.     denosumab (PROLIA) 60 MG/ML SOSY injection Inject 60 mg into the skin every 6 (six) months.     escitalopram (LEXAPRO) 10 MG tablet TAKE 1 AND 1/2 TABLETS BY MOUTH DAILY 135 tablet 0   fluticasone (FLONASE) 50 MCG/ACT nasal spray Place 2 sprays into both nostrils 2 (two) times daily as needed for allergies or rhinitis.     furosemide (LASIX) 20 MG tablet Take 1 tablet (20 mg total) by mouth daily. 90 tablet 3   Multiple Vitamins-Minerals (PRESERVISION AREDS 2 PO) Take 1 tablet by mouth 2 (two) times daily.     ondansetron (ZOFRAN-ODT) 4 MG disintegrating tablet Take 1 tablet (4 mg total) by mouth every 8 (eight)  hours as needed for nausea or vomiting. 20 tablet 0   albuterol (PROVENTIL HFA;VENTOLIN HFA) 108 (90 Base) MCG/ACT inhaler Inhale 2 puffs into the lungs every 4 (four) hours as needed for wheezing or shortness of breath.     primidone (MYSOLINE) 50 MG tablet Take 0.5 tablets (25 mg total) by mouth at bedtime. (Patient not taking: Reported on 01/28/2021) 30 tablet 0   No facility-administered medications prior to visit.    Allergies  Allergen Reactions   Amoxicillin Rash   Epinephrine Palpitations   Shellfish Allergy Anaphylaxis   Morphine And Related Other (See Comments)    Irregular  heart beat   Atarax [Hydroxyzine] Anxiety   Biaxin [Clarithromycin] Rash   Codeine Palpitations   Doxycycline Rash   Novocain [Procaine] Palpitations   Penicillins Rash        Objective:    Physical Exam Vitals and nursing note reviewed.  Constitutional:      Appearance: Normal appearance.  Cardiovascular:     Rate and Rhythm: Normal rate and regular rhythm.  Pulmonary:     Effort: Pulmonary effort is normal.     Breath sounds: Normal breath sounds.  Musculoskeletal:        General: Normal range of motion.  Skin:    General: Skin is warm and dry.  Neurological:     Mental Status: She is alert.  Psychiatric:        Mood and Affect: Mood normal.        Behavior: Behavior normal.    BP 119/67    Pulse 71    Temp 98.2 F (36.8 C) (Temporal)    Ht 5' (1.524 m)    Wt 139 lb 3.2 oz (63.1 kg)    SpO2 97%    BMI 27.19 kg/m  Wt Readings from Last 3 Encounters:  03/06/21 139 lb 3.2 oz (63.1 kg)  02/24/21 135 lb (61.2 kg)  01/28/21 139 lb 15.9 oz (63.5 kg)       Assessment & Plan:   Problem List Items Addressed This Visit       Other   Post-COVID chronic cough - Primary    finished the Molnupiravir, but still having persistent productive cough, reports hx of PNA x 5, last time a year ago. lungs sound wnl. Sending pred pack, and refilling inhaler, advised pt and dtr (present for visit) on use & SE. Drink 2 liters of water daily.      Relevant Medications   predniSONE (DELTASONE) 20 MG tablet   albuterol (VENTOLIN HFA) 108 (90 Base) MCG/ACT inhaler    Meds ordered this encounter  Medications   predniSONE (DELTASONE) 20 MG tablet    Sig: Take 2 pills in the morning with breakfast for 3 days, then 1 pill for 2 days    Dispense:  8 tablet    Refill:  0    Order Specific Question:   Supervising Provider    Answer:   ANDY, CAMILLE L [[id number]]   albuterol (VENTOLIN HFA) 108 (90 Base) MCG/ACT inhaler    Sig: Inhale 2 puffs into the lungs every 6 (six) hours as needed for  wheezing or shortness of breath. For current cough, use at least in am and before bedtime until resolved then prn    Dispense:  18 g    Refill:  0    Order Specific Question:   Supervising Provider    Answer:   ANDY, CAMILLE L [[id number]]

## 2021-03-17 NOTE — Progress Notes (Signed)
Mariah Bryant is a 88 y.o. female here for post viral cough.  History of Present Illness:   Chief Complaint  Patient presents with   Cough    Pt is c/o lingering cough, had COVID 12/26. Pt is coughing and expectorating yellow/clear. Pt has hx of pneumonia.   Fatigue    HPI  Cough Mariah Bryant presents with c/o productive cough that has been onset for about 3 weeks.  She went to the emergency department on 02/24/2021 and tested positive for COVID.  At that time she was having intermittent fever and diarrhea.  She was dehydrated and received fluids.  She was prescribed mono.  We are in [place].  She completed this course.  She also had a chest x-ray at the time and this was normal.  Pt did recently visit Jeanie Sewer, FNP for ongoing cough on 03/06/2021.  She was given prednisone and albuterol.  Took the prednisone that was prescribed. Using the albuterol inhaler twice daily still at this time.  Her daughter is with her today.  She states that patient is prone to pneumonia and wants to make sure that its not going on.  Patient will sometimes have ongoing cough spells that are keeping her up.  She has not tried anything over-the-counter for her cough.  She does notice some pain in her sternum from coughing this pain is tender to palpation and feels like she has a lump here.  She has a pulse ox at home and is reading 92 to 98% consistently.  Denies exertional chest pain or significant swelling in her legs.  Fatigue She is also having ongoing fatigue.  She feels like this is very slowly improving with time.  She recently tried to go outside and walk some and felt that this was helpful for her energy levels.  Her daughter reports that she sleeps throughout the day as able.  She denies any significant unintentional weight changes.  Her appetite is still somewhat diminished since COVID.  She is trying to stay well-hydrated.  Breast pain Patient reports that she has a very tender lump on her right outer  breast.  States that has been there for a few weeks.  She no longer gets mammograms due to her age.  I do not have any of her prior mammograms available to me.  She denies any nipple discharge or other pain in her breast.  She does have the sternal pain, listed above.   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   Chronic pain of left knee 08/16/2017   Depressive disorder 07/17/2009   Gastro-esophageal reflux disease without esophagitis 10/07/2010   Hiatal hernia 10/07/2010   Hypercholesterolemia 07/17/2009   Hypogammaglobulinemia ([place]) 08/15/2017   Macular degeneration 08/16/2017   Orthostatic intolerance 08/16/2017   Osteoarthritis 07/17/2009   Osteoporosis 08/16/2017   Seborrheic dermatitis 10/07/2010   Sleep apnea 07/17/2009   Temporomandibular joint disorder 10/07/2010     Social History   Tobacco Use   Smoking status: Never   Smokeless tobacco: Never  Vaping Use   Vaping Use: Never used  Substance Use Topics   Alcohol use: Yes    Alcohol/week: 1.0 standard drink    Types: 1 Glasses of wine per week    Comment: occasionally   Drug use: Never    Past Surgical History:  Procedure Laterality Date   APPENDECTOMY     CATARACT EXTRACTION, BILATERAL     CHOLECYSTECTOMY     HYSTERECTOMY ABDOMINAL WITH SALPINGO-OOPHORECTOMY     REPLACEMENT TOTAL KNEE      Family History  Problem Relation Age of Onset   Stroke Mother    Arthritis Mother    Irregular heart beat Father    Cancer Sister    Cancer Brother    Congestive Heart Failure Paternal Grandmother     Allergies  Allergen Reactions   Amoxicillin Rash   Epinephrine Palpitations   Shellfish Allergy Anaphylaxis   Morphine And Related Other (See Comments)    Irregular heart beat   Atarax [Hydroxyzine] Anxiety   Biaxin [Clarithromycin] Rash   Codeine  Palpitations   Doxycycline Rash   Novocain [Procaine] Palpitations   Penicillins Rash    Current Medications:   Current Outpatient Medications:    albuterol (VENTOLIN HFA) 108 (90 Base) MCG/ACT inhaler, Inhale 2 puffs into the lungs every 6 (six) hours as needed for wheezing or shortness of breath. For current cough, use at least in am and before bedtime until resolved then prn, Disp: 18 g, Rfl: 0   ALPRAZolam (XANAX) 0.25 MG tablet, TAKE 1 TABLET (0.25 MG TOTAL) BY MOUTH 2 (TWO) TIMES DAILY AS NEEDED FOR ANXIETY OR SLEEP., Disp: 30 tablet, Rfl: 0   amiodarone (PACERONE) 200 MG tablet, TAKE 1 TABLET BY MOUTH EVERY DAY, Disp: 90 tablet, Rfl: 3   apixaban (ELIQUIS) 5 MG TABS tablet, TAKE 1 TABLET BY MOUTH TWICE A DAY, Disp: 60 tablet, Rfl: 5   benzonatate (TESSALON) 100 MG capsule, Take 1 capsule (100 mg total) by mouth 3 (three) times daily as needed for cough., Disp: 30 capsule, Rfl: 1   Cholecalciferol 25 MCG (1000 UT) tablet, Take by mouth daily., Disp: , Rfl:    denosumab (PROLIA) 60 MG/ML SOSY injection, Inject 60 mg into the skin every 6 (six) months., Disp: , Rfl:    escitalopram (LEXAPRO) 10 MG tablet, TAKE 1 AND 1/2 TABLETS BY MOUTH DAILY, Disp: 135 tablet, Rfl: 0   furosemide (LASIX) 20 MG tablet, Take 1 tablet (20 mg total) by mouth daily., Disp: 90 tablet, Rfl: 3   Multiple Vitamins-Minerals (PRESERVISION AREDS 2 PO), Take 1 tablet by mouth 2 (two) times daily., Disp: , Rfl:    ondansetron (ZOFRAN-ODT) 4 MG disintegrating tablet, Take 1 tablet (4 mg total) by mouth every 8 (eight) hours as needed for nausea or vomiting., Disp: 20 tablet, Rfl: 0   budesonide (PULMICORT) 180 MCG/ACT inhaler, Take 2 puffs by mouth 2 (two) times daily as needed. (Patient not taking: Reported on 03/18/2021), Disp: , Rfl:    fluticasone (FLONASE) 50 MCG/ACT nasal spray, Place 2 sprays into both nostrils 2 (two) times daily as needed for allergies or rhinitis. (Patient not taking: Reported on 03/18/2021),  Disp: , Rfl:    Review of Systems:   ROS Negative unless otherwise specified per HPI. Vitals:   Vitals:   03/18/21 1035  BP: 110/60  Pulse: 72  Temp: 98.6 F (37 C)  TempSrc: Temporal  SpO2: 95%  Weight: 138 lb 4 oz (62.7 kg)  Height: 5' (1.524 m)     Body mass index is 27 kg/m.  Physical Exam:   Physical Exam Vitals and nursing note reviewed.  Constitutional:      General: She is not in acute distress.    Appearance: She is well-developed. She is not ill-appearing or toxic-appearing.  Cardiovascular:  Rate and Rhythm: Normal rate and regular rhythm.     Pulses: Normal pulses.     Heart sounds: Normal heart sounds, S1 normal and S2 normal.  Pulmonary:     Effort: Pulmonary effort is normal.     Breath sounds: Normal breath sounds.  Chest:    Skin:    General: Skin is warm and dry.  Neurological:     Mental Status: She is alert.     GCS: GCS eye subscore is 4. GCS verbal subscore is 5. GCS motor subscore is 6.  Psychiatric:        Speech: Speech normal.        Behavior: Behavior normal. Behavior is cooperative.    Assessment and Plan:   Breast pain I would like to order a stat mammogram for further evaluation I put an order at [facility], and I was told that patient was not able to be seen until her prior mammogram records were reviewed by their office We informed patient and daughter about this protocol I asked patient and daughter to let me know if they have any updates on this process after they signed these papers Will determine further next steps based on this process  Sternal pain Possibly inflammation from coughing Wells Score 0 Did discuss possibly getting a chest x-ray however I did recommend that first we evaluate her breast to let the sonographer know that she is having sternal pain as well in case this can be evaluated during her mammogram If ongoing and unable to be imaged by mammography, will order chest x-ray or  other imaging modality to evaluate further  Post-COVID chronic cough Ongoing but improving Lung exam normal Recommend Tessalon Perles and over-the-counter 12-hour dextromethorphan I do not feel as though she needs a chest x-ray or blood work at this time May continue to use albuterol as needed If ongoing, consider ICS or referral to pulmonology  Other post infection and related fatigue syndromes Ongoing but improving No red flags on exam I feel as if we improve her cough and she can get adequate rest, her fatigue may improve Continue to monitor Offered labs but we will continue to watch her symptoms in the meantime I did recommend that she follow-up with me in 1 month for further evaluation and check in on how she is doing, sooner if concerns  Inda Coke, PA-C

## 2021-03-18 ENCOUNTER — Other Ambulatory Visit: Payer: Self-pay

## 2021-03-18 ENCOUNTER — Ambulatory Visit (INDEPENDENT_AMBULATORY_CARE_PROVIDER_SITE_OTHER): Payer: Medicare Other | Admitting: Physician Assistant

## 2021-03-18 ENCOUNTER — Encounter: Payer: Self-pay | Admitting: Physician Assistant

## 2021-03-18 VITALS — BP 110/60 | HR 72 | Temp 98.6°F | Ht 60.0 in | Wt 138.2 lb

## 2021-03-18 DIAGNOSIS — R053 Chronic cough: Secondary | ICD-10-CM | POA: Diagnosis not present

## 2021-03-18 DIAGNOSIS — G9339 Other post infection and related fatigue syndromes: Secondary | ICD-10-CM

## 2021-03-18 DIAGNOSIS — R0789 Other chest pain: Secondary | ICD-10-CM

## 2021-03-18 DIAGNOSIS — U099 Post covid-19 condition, unspecified: Secondary | ICD-10-CM

## 2021-03-18 DIAGNOSIS — N644 Mastodynia: Secondary | ICD-10-CM

## 2021-03-18 MED ORDER — BENZONATATE 100 MG PO CAPS: 100.0000 mg | ORAL_CAPSULE | Freq: Three times a day (TID) | ORAL | 1 refills | Status: DC | PRN

## 2021-03-18 NOTE — Patient Instructions (Addendum)
It was great to see you!  We are ordering a mammogram to further evaluate your breast pain If they are unable to evaluate your sternal pain during this test, please let me know and I can order an xray for this area.  In order to have your mammogram done, you will need to go to [facility] to sign a record release. Once they get your records, they can schedule your mammogram Address: [address], [place], [place] [address] Open ? Closes 5:30?PM Phone: [phone]  For your cough: Please take 12-hour dextromethorphan (such as delsym) cough syrup Consider plain Mucinex I have also sent in [place]  If your fatigue and cough do not continue to improve or if they worsen, please follow-up with me  Let's follow-up in 1 month to check in a , sooner if you have concerns.  If a referral was placed today, you will be contacted for an appointment. Please note that routine referrals can sometimes take up to 3-4 weeks to process. Please call our office if you haven't heard anything after this time frame.  Take care,  Inda Coke PA-C

## 2021-03-19 ENCOUNTER — Telehealth: Payer: Self-pay

## 2021-03-19 ENCOUNTER — Encounter: Payer: Self-pay | Admitting: Physician Assistant

## 2021-03-19 NOTE — Telephone Encounter (Signed)
[facility] called stating they could not complete stat mamo until they have received records from previous facility. After speaking with Provider and referral coordinator, found out that this was communicated to the patient. I followed up with patient in regard.  Patient states she completed a medical records release for GI yesterday.  States she will follow up with their records department.

## 2021-03-19 NOTE — Telephone Encounter (Signed)
FYI

## 2021-03-22 ENCOUNTER — Other Ambulatory Visit: Payer: Self-pay | Admitting: Student

## 2021-03-24 DIAGNOSIS — H43821 Vitreomacular adhesion, right eye: Secondary | ICD-10-CM | POA: Diagnosis not present

## 2021-03-24 DIAGNOSIS — H353112 Nonexudative age-related macular degeneration, right eye, intermediate dry stage: Secondary | ICD-10-CM | POA: Diagnosis not present

## 2021-03-24 DIAGNOSIS — H353221 Exudative age-related macular degeneration, left eye, with active choroidal neovascularization: Secondary | ICD-10-CM | POA: Diagnosis not present

## 2021-03-27 ENCOUNTER — Other Ambulatory Visit: Payer: Self-pay | Admitting: Physician Assistant

## 2021-03-27 DIAGNOSIS — N644 Mastodynia: Secondary | ICD-10-CM

## 2021-04-02 ENCOUNTER — Ambulatory Visit (INDEPENDENT_AMBULATORY_CARE_PROVIDER_SITE_OTHER): Payer: Medicare Other

## 2021-04-02 ENCOUNTER — Other Ambulatory Visit: Payer: Self-pay

## 2021-04-02 DIAGNOSIS — M810 Age-related osteoporosis without current pathological fracture: Secondary | ICD-10-CM | POA: Diagnosis not present

## 2021-04-02 MED ORDER — DENOSUMAB 60 MG/ML ~~LOC~~ SOSY
60.0000 mg | PREFILLED_SYRINGE | Freq: Once | SUBCUTANEOUS | Status: AC
  Administered 2021-04-02: 60 mg via SUBCUTANEOUS

## 2021-04-02 NOTE — Progress Notes (Signed)
Per orders of Inda Coke, [place], injection of Prolia  given by Tobe Sos in right arm. Patient tolerated injection well. Patient will make appointment for 6 months.

## 2021-04-15 ENCOUNTER — Other Ambulatory Visit: Payer: Self-pay | Admitting: Physician Assistant

## 2021-04-20 ENCOUNTER — Ambulatory Visit: Payer: Medicare Other | Admitting: Physician Assistant

## 2021-04-21 ENCOUNTER — Ambulatory Visit
Admission: RE | Admit: 2021-04-21 | Discharge: 2021-04-21 | Disposition: A | Discharge status: Home / Self Care | Payer: Medicare Other | Source: Ambulatory Visit | Attending: Physician Assistant | Admitting: Physician Assistant

## 2021-04-21 DIAGNOSIS — N644 Mastodynia: Secondary | ICD-10-CM

## 2021-04-21 DIAGNOSIS — R922 Inconclusive mammogram: Secondary | ICD-10-CM | POA: Diagnosis not present

## 2021-04-24 ENCOUNTER — Encounter: Payer: Self-pay | Admitting: Physician Assistant

## 2021-04-24 ENCOUNTER — Other Ambulatory Visit: Payer: Self-pay

## 2021-04-24 ENCOUNTER — Telehealth: Payer: Self-pay

## 2021-04-24 ENCOUNTER — Ambulatory Visit (INDEPENDENT_AMBULATORY_CARE_PROVIDER_SITE_OTHER): Payer: Medicare Other | Admitting: Physician Assistant

## 2021-04-24 VITALS — BP 124/75 | HR 65 | Temp 98.3°F | Ht 60.0 in | Wt 137.4 lb

## 2021-04-24 DIAGNOSIS — F419 Anxiety disorder, unspecified: Secondary | ICD-10-CM

## 2021-04-24 DIAGNOSIS — N644 Mastodynia: Secondary | ICD-10-CM

## 2021-04-24 DIAGNOSIS — R5383 Other fatigue: Secondary | ICD-10-CM

## 2021-04-24 DIAGNOSIS — R5381 Other malaise: Secondary | ICD-10-CM | POA: Diagnosis not present

## 2021-04-24 DIAGNOSIS — F32A Depression, unspecified: Secondary | ICD-10-CM | POA: Diagnosis not present

## 2021-04-24 DIAGNOSIS — M25521 Pain in right elbow: Secondary | ICD-10-CM

## 2021-04-24 DIAGNOSIS — M67442 Ganglion, left hand: Secondary | ICD-10-CM

## 2021-04-24 NOTE — Progress Notes (Addendum)
Mariah Bryant is a 88 y.o. female here for a follow up of breast pain.   History of Present Illness:   Chief Complaint  Patient presents with   Breast Pain    Pt states imaging told her it is dense tissue that is causing the pain.    Extremity Weakness    Pain in right elbow, sharp pains goes up to shoulder blade.    Mariah Bryant presented to today's visit with her daughter, Mariah Bryant.  Breast Pain  On 04/21/21, pt had a diagnostic mammogram and [place] of right breast completed which found no evidence of malignancy within either breast. According to pt, she was told that her breast tissue was dense and that could be what was causing her pain.    Right Elbow Pain Mariah Bryant presents with c/o constant right elbow pain that has been onset for a couple of weeks. Pt describes pain as a sharp shooting sensation that radiates up her arm and over into her left shoulder blade. She expresses belief that increased sitting and leaning on that arm could have exacerbated this. Currently states that the pain has gotten so nerve racking that she can no longer sleep on that side at night. Although she doesn't believe this has made her weak, it has made it difficult to use her arm. She has not taken anything for this due to not wanting to have an adverse reaction with her eliquis 5 mg.   Denies recent injury or trauma.    Ganglion Cyst Pt has a ganglion cyst on her ringer finger of left hand that has been previously evaluated by Dr. Georgina Snell, sports medicine. Although she was not having any pain, she did have the cyst drained by Dr. Georgina Snell, who recommended to have it removed if drainage intervention did not help. Despite having no pain, she is concerned at contracting an infection from the cyst sporadically draining and would like to have this removed. Denies fever or chills.    Anxiety/Depression Mariah Bryant is currently complaint with taking lexapro 10 mg daily and xanax 0.25 mg nightly with no adverse effects. She finds this  medication regimen to be beneficial for her and she is managing well. Denies SI/HI.    Fatigue In regards to her fatigue, she has noticed that although it is still present  following COVID 19 infection, it has been improving over time. Pt expressed belief that her daughter encouraging her to walk daily, has improved this gradually. She is managing well at this time.   Physical deconditioning Patient reports that she often feels like a burden to her daughter because she is unable to drive.  She is wondering if she can speak with a Education officer, museum about transportation issues.  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/07/2010   Chronic pain of left knee 08/16/2017   Depressive disorder 07/17/2009   Gastro-esophageal reflux disease without esophagitis 10/07/2010   Hiatal hernia 10/07/2010   Hypercholesterolemia 07/17/2009   Hypogammaglobulinemia ([place]) 08/15/2017   Macular degeneration 08/16/2017   Orthostatic intolerance 08/16/2017   Osteoarthritis 07/17/2009   Osteoporosis 08/16/2017   Seborrheic dermatitis 10/07/2010   Sleep apnea 07/17/2009   Temporomandibular joint disorder 10/07/2010     Social History   Tobacco Use   Smoking status: Never   Smokeless tobacco: Never  Vaping Use   Vaping  Use: Never used  Substance Use Topics   Alcohol use: Yes    Alcohol/week: 1.0 standard drink    Types: 1 Glasses of wine per week    Comment: occasionally   Drug use: Never    Past Surgical History:  Procedure Laterality Date   APPENDECTOMY     CATARACT EXTRACTION, BILATERAL     CHOLECYSTECTOMY     HYSTERECTOMY ABDOMINAL WITH SALPINGO-OOPHORECTOMY     REPLACEMENT TOTAL KNEE      Family History  Problem Relation Age of Onset   Breast cancer Mother 76   Stroke Mother    Arthritis Mother     Irregular heart beat Father    Cancer Sister    Congestive Heart Failure Paternal Grandmother    Cancer Brother    Breast cancer Niece 15    Allergies  Allergen Reactions   Amoxicillin Rash   Epinephrine Palpitations   Shellfish Allergy Anaphylaxis   Morphine And Related Other (See Comments)    Irregular heart beat   Atarax [Hydroxyzine] Anxiety   Biaxin [Clarithromycin] Rash   Codeine Palpitations   Doxycycline Rash   Novocain [Procaine] Palpitations   Penicillins Rash    Current Medications:   Current Outpatient Medications:    ALPRAZolam (XANAX) 0.25 MG tablet, TAKE 1 TABLET (0.25 MG TOTAL) BY MOUTH 2 (TWO) TIMES DAILY AS NEEDED FOR ANXIETY OR SLEEP., Disp: 30 tablet, Rfl: 0   amiodarone (PACERONE) 200 MG tablet, TAKE 1 TABLET BY MOUTH EVERY DAY, Disp: 90 tablet, Rfl: 3   apixaban (ELIQUIS) 5 MG TABS tablet, TAKE 1 TABLET BY MOUTH TWICE A DAY, Disp: 60 tablet, Rfl: 5   Cholecalciferol 25 MCG (1000 UT) tablet, Take by mouth daily., Disp: , Rfl:    denosumab (PROLIA) 60 MG/ML SOSY injection, Inject 60 mg into the skin every 6 (six) months., Disp: , Rfl:    escitalopram (LEXAPRO) 10 MG tablet, TAKE 1 AND 1/2 TABLETS DAILY BY MOUTH, Disp: 135 tablet, Rfl: 0   fluticasone (FLONASE) 50 MCG/ACT nasal spray, Place 2 sprays into both nostrils 2 (two) times daily as needed for allergies or rhinitis., Disp: , Rfl:    furosemide (LASIX) 20 MG tablet, TAKE 1 TABLET BY MOUTH EVERY DAY, Disp: 90 tablet, Rfl: 2   Multiple Vitamins-Minerals (PRESERVISION AREDS 2 PO), Take 1 tablet by mouth 2 (two) times daily., Disp: , Rfl:    albuterol (VENTOLIN HFA) 108 (90 Base) MCG/ACT inhaler, Inhale 2 puffs into the lungs every 6 (six) hours as needed for wheezing or shortness of breath. For current cough, use at least in am and before bedtime until resolved then prn (Patient not taking: Reported on 04/24/2021), Disp: 18 g, Rfl: 0   benzonatate (TESSALON) 100 MG capsule, Take 1  capsule (100 mg total) by mouth 3 (three) times daily as needed for cough. (Patient not taking: Reported on 04/24/2021), Disp: 30 capsule, Rfl: 1   budesonide (PULMICORT) 180 MCG/ACT inhaler, Take 2 puffs by mouth 2 (two) times daily as needed. (Patient not taking: Reported on 04/24/2021), Disp: , Rfl:    ondansetron (ZOFRAN-ODT) 4 MG disintegrating tablet, Take 1 tablet (4 mg total) by mouth every 8 (eight) hours as needed for nausea or vomiting. (Patient not taking: Reported on 04/24/2021), Disp: 20 tablet, Rfl: 0   Review of Systems:   Review of Systems  Musculoskeletal:  Positive for extremity weakness.   Vitals:   Vitals:   04/24/21 1034  BP: 124/75  Pulse: 65  Temp: 98.3 F (36.8  C)  TempSrc: Temporal  SpO2: 97%  Weight: 137 lb 6.4 oz (62.3 kg)  Height: 5' (1.524 m)     Body mass index is 26.83 kg/m.  Physical Exam:   Physical Exam Vitals and nursing note reviewed.  Constitutional:      General: She is not in acute distress.    Appearance: She is well-developed. She is not ill-appearing or toxic-appearing.  Cardiovascular:     Rate and Rhythm: Normal rate and regular rhythm.     Pulses: Normal pulses.     Heart sounds: Normal heart sounds, S1 normal and S2 normal.  Pulmonary:     Effort: Pulmonary effort is normal.     Breath sounds: Normal breath sounds.  Skin:    General: Skin is warm and dry.  Neurological:     Mental Status: She is alert.     GCS: GCS eye subscore is 4. GCS verbal subscore is 5. GCS motor subscore is 6.  Psychiatric:        Speech: Speech normal.        Behavior: Behavior normal. Behavior is cooperative.    Assessment and Plan:   Ganglion cyst of finger of left hand Referral to hand surgery per patient request  Right elbow pain Unclear etiology Patient and daughter would like to trial physical therapy first I have put a referral in for physical therapy May trial topical Voltaren gel and/or Tylenol as needed If lack of improvement or  worsening, will trial sports medicine referral  Physical deconditioning I have put in a social work referral for patient  Breast pain We reviewed her mammogram which was reassuring  Fatigue, unspecified type Fatigue is improving  Continue to work on eating healthy and drinking plenty of water  Anxiety and depression Patient is currently well controlled Continue Lexapro 10 mg daily and half of a Xanax 0.25 mg nightly Follow-up in 3 to 6 months, sooner if concerns  I,Havlyn C Ratchford,acting as a scribe for [employer], PA.,have documented all relevant documentation on the behalf of Inda Coke, PA,as directed by  Inda Coke, PA while in the presence of Inda Coke, [place].  I, Inda Coke, [place], have reviewed all documentation for this visit. The documentation on 04/24/21 for the exam, diagnosis, procedures, and orders are all accurate and complete.   Inda Coke, PA-C

## 2021-04-24 NOTE — Telephone Encounter (Signed)
° °  Telephone encounter was:  Successful.  04/24/2021 Name: Mariah Bryant MRN: [id number] DOB: February 28, 1931  [name] is a 88 y.o. year old female who is a primary care patient of Inda Coke, [place] . The community resource team was consulted for assistance with Transportation Needs   Care guide performed the following interventions: Patient provided with information about care guide support team and interviewed to confirm resource needs.Patient and daughter will call insureance to confirm her insurance covers transportation when they get home and will return my call they didnt have time to call while I was with them on the phone  Follow Up Plan:  Care guide will follow up with patient by phone over the next day    [place], Care Management  [phone] [address], [place], [place] [address] Phone: [phone] Email: [email or web address]

## 2021-04-24 NOTE — Patient Instructions (Signed)
It was great to see you!  I'm going to have the Environmental education officer you about transportation  I'm putting in a referral for a hand surgeon  Please schedule an appointment with [name] or Selinda Eon for physical therapy (referral has been placed)  Follow-up as needed  Take care,  Inda Coke PA-C

## 2021-04-27 ENCOUNTER — Ambulatory Visit (INDEPENDENT_AMBULATORY_CARE_PROVIDER_SITE_OTHER): Payer: Medicare Other | Admitting: Physical Therapy

## 2021-04-27 ENCOUNTER — Other Ambulatory Visit: Payer: Self-pay

## 2021-04-27 ENCOUNTER — Telehealth: Payer: Self-pay

## 2021-04-27 ENCOUNTER — Encounter: Payer: Self-pay | Admitting: Physical Therapy

## 2021-04-27 DIAGNOSIS — M25521 Pain in right elbow: Secondary | ICD-10-CM

## 2021-04-27 DIAGNOSIS — M542 Cervicalgia: Secondary | ICD-10-CM

## 2021-04-27 NOTE — Therapy (Signed)
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   Chronic pain of left knee 08/16/2017   Depressive disorder 07/17/2009   Gastro-esophageal reflux disease without esophagitis 10/07/2010   Hiatal hernia 10/07/2010   Hypercholesterolemia 07/17/2009   Hypogammaglobulinemia ([place]) 08/15/2017   Macular degeneration 08/16/2017   Orthostatic intolerance 08/16/2017   Osteoarthritis 07/17/2009   Osteoporosis 08/16/2017   Seborrheic dermatitis 10/07/2010   Sleep apnea 07/17/2009   Temporomandibular joint disorder 10/07/2010   Past Surgical History:  Procedure Laterality Date   APPENDECTOMY     CATARACT EXTRACTION, BILATERAL     CHOLECYSTECTOMY     HYSTERECTOMY ABDOMINAL WITH SALPINGO-OOPHORECTOMY     REPLACEMENT TOTAL KNEE     Patient Active Problem List   Diagnosis Date Noted   Post-COVID chronic cough 03/06/2021   Essential tremor 08/26/2020   CHF (congestive heart failure) ([place]) 02/15/2020   Positive ANA (antinuclear antibody) 01/28/2020   RLS (restless legs syndrome) 08/27/2017   Chronic toe pain, right third toe 08/27/2017   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, mild stage 08/27/2017   Macular degeneration of both eyes 08/27/2017   Difficulty walking 08/27/2017   Hearing loss, with bilateral hearing aids [facility] 08/19/2017   Osteoporosis, on Prolia 08/16/2017   Chronic pain of left knee  08/16/2017   Chronic left-sided low back pain without sciatica 08/16/2017   Macular degeneration 08/16/2017   Orthostatic intolerance 08/16/2017   Aortic atherosclerosis ([place]) 08/16/2017   Degenerative arthritis of lumbar spine 08/16/2017   Allergic rhinitis, on Flonase prn 08/15/2017   PAF (paroxysmal atrial fibrillation) ([place]) 05/05/2017   Status post total right knee replacement 09/17/2015   Atrial paroxysmal tachycardia ([place]) 12/06/2012   Brown's syndrome, with double vision 10/07/2010   Hiatal hernia, without GERD 10/07/2010   Mitral valve prolapse 10/07/2010   Seborrheic dermatitis, controlled with [place] 10/07/2010   Temporomandibular joint disorder, right side 10/07/2010   Mild persistent asthma, on daily Pulmicort 07/17/2009   Depressive disorder, on Lexapro 07/17/2009   Hypercholesterolemia, no statin 07/17/2009   Osteoarthritis 07/17/2009    PCP: Inda Coke, PA  REFERRING PROVIDER: Inda Coke, PA  REFERRING DIAG: R Elbow Pain  THERAPY DIAG:  Cervicalgia  Pain in right elbow   ONSET DATE: 3 weeks ago  SUBJECTIVE:  SUBJECTIVE STATEMENT: Pt states recent onset of significant pain in her arm, elbow, into forearm and hand. She reports never having pain in arm, despite UE fracture when she was a child. Reports Nerve pain into forearm and into fingers at times  and also up into back of arm into shoulder blade.  Hard time sleeping at night, has tried other positioning. Thinks she Spent more time sitting, leaning onto R elbow reading when she had covid recently.   PERTINENT HISTORY: OA, Osteoporosis, CHF, Macular degeneration.   PAIN:  Are you having pain? Yes NPRS scale: 10/10 Pain location: Elbow Pain orientation: Right  PAIN TYPE: aching "just hurts" ,not necessarily tingling   Pain description: intermittent  Aggravating factors: none stated Relieving factors: None stated   Are you having pain? Yes NPRS scale: 6-10/10 Pain location: Neck, R shoulder blade Pain orientation: Right  PAIN TYPE: aching Pain description: intermittent  Aggravating factors: none stated Relieving factors: None stated  PRECAUTIONS: None  WEIGHT BEARING RESTRICTIONS No  FALLS:  Has patient fallen in last 6 months? Yes Number of falls: 1 1 fall about 1 year ago, slipped when she was getting out of bed, did hit head but no injury.    PLOF: Independent  PATIENT GOALS  Decreased pain in arm.   OBJECTIVE:   DIAGNOSTIC FINDINGS:  No recent imaging  COGNITION:  Overall cognitive status: Within functional limits for tasks assessed  POSTURE: Forward head   AROM/PROM: 04/27/21:    Cervical: Limited rotation: 30 deg bil, R rotation reproduces UE symptoms.  Shoulder: mild limitation for full flexion and ER Elbow: WFL Hand: limited finger extension, due to mild flexion contractures.    UPPER EXTREMITY MMT: 04/27/21: Shoulders: 4/5, Elbow: 4/5   SHOULDER SPECIAL TESTS: -Cubital tunnel: Supine: Prolonged Elbow flexion does not reproduce sympotms. Seated, some increase in pain. No change in pain with elbow position change.  -Seated and supine: Cervical ROM, mostly R rotation, directly increases pain down R UE, returning head to neutral or doing L rotation improves symptoms. Pain worse in seated vs supine position.  -ULTT Neg on R    JOINT MOBILITY TESTING:  Hypomobile c-spine, minimal/mild hypomobility in GHJ  PALPATION:  Tenderness to palpate medial elbow and ulnar groove, as well as lateral elbow and radial head. Stiffness and hypomobility in c-spine.    TODAY'S TREATMENT:  Cervical PROM, Cervical manual traction, Cervical PA mobs.  Ther ex: education on shoulder rolls, scap squeeze and cervical rotation for HEP.    PATIENT EDUCATION: Education details: PT POC,  Exam findings, HEP, Discussion on pain likely stemming from neck.  Person educated: Patient Education method: Explanation, Demonstration, Tactile cues, Verbal cues, and Handouts Education comprehension: verbalized understanding, returned demonstration, verbal cues required, tactile cues required, and needs further education   HOME EXERCISE PROGRAM: Access Code: [email or web address] URL: [email or web address] Date: 04/27/2021 Prepared by: Lyndee Hensen  Exercises Standing Backward Shoulder Rolls - 2 x daily - 1 sets - 10 reps Seated Scapular Retraction - 2 x daily - 1 sets - 10 reps Seated Cervical Rotation AROM - 2 x daily - 1 sets - 10 reps   ASSESSMENT:  CLINICAL IMPRESSION: Pt presents with primary complaint of increased pain in R UE. She does have tenderness with palpation of R elbow, mostly at cubital tunnel. After testing today, pain seems to be stemming from neck, with increased pain in arm with cervical positioning and ROM. Discussed findings with pt. Pt with significant pain in UE, and has decreased ability for  full functional activity, ADLs, IADLs, due to pain. Plan to focus on c-spine as source of pain. Pt to benefit from skilled PT to improve deficits and pain. Will refer to ortho or spine specialist if pain does not improve with PT.    OBJECTIVE IMPAIRMENTS decreased activity tolerance, decreased mobility, decreased ROM, decreased strength, impaired flexibility, and pain.   ACTIVITY LIMITATIONS cleaning, community activity, driving, meal prep, laundry, and shopping.   PERSONAL FACTORS  None  are also affecting patient's functional outcome.    REHAB POTENTIAL: Fair   CLINICAL DECISION MAKING: Evolving/moderate complexity  EVALUATION COMPLEXITY: Moderate   GOALS: Goals reviewed with patient? Yes  SHORT TERM GOALS:  STG Name Target Date Goal status  1 Patient will be independent with initial HEP   05/11/21 INITIAL  2 Pt to demo decreased pain in UE to 4/10   05/11/21 INITIAL         LONG TERM GOALS:   LTG Name Target Date 6 weeks  Goal status  1 Patient will be independent with final HEP   06/08/21 INITIAL  2 Pt to report decreased pain in neck and UE  to 0-2/10   06/08/21 INITIAL  3 Pt to demo ability for cervical ROM to be non painful, to improve ability for ADLs and IADLs.   06/08/21 INITIAL  4 Pt to demonstrate and ability for lift, reach, and carry with R UE , without pain.   06/08/21 INITIAL  5         PLAN: PT FREQUENCY: 1-2x/week  PT DURATION: 6 weeks  PLANNED INTERVENTIONS: Therapeutic exercises, Therapeutic activity, Neuro Muscular re-education, Balance training, Patient/Family education, Joint mobilization, DME instructions, Dry Needling, Spinal mobilization, Cryotherapy, Moist heat, Taping, Traction, Ionotophoresis 4mg /ml Dexamethasone, and Manual therapy  PLAN FOR NEXT SESSION:  Cervical mobilization, posture education, postural strengthening, Distraction/ traction   Lyndee Hensen, PT 04/27/2021, 3:06 PM

## 2021-04-27 NOTE — Telephone Encounter (Signed)
° °  Telephone encounter was:  Successful.  04/27/2021 Name: [name] MRN: [id number] DOB: 09-19-1930  Mariah Bryant is a 88 y.o. year old female who is a primary care patient of Inda Coke, [place] . The community resource team was consulted for assistance with Transportation Needs   Care guide performed the following interventions: Patient provided with information about care guide support team and interviewed to confirm resource needs.Transportation is not covered with insurance and transportation isnt needed at this time however I am mailing other resources for transportation if [facility] cant provide it for her  Follow Up Plan:  No further follow up planned at this time. The patient has been provided with needed resources.    [place], Care Management  [phone] [address], [place], [place] [address] Phone: [phone] Email: [email or web address]

## 2021-04-30 ENCOUNTER — Encounter: Payer: Self-pay | Admitting: Physical Therapy

## 2021-04-30 ENCOUNTER — Ambulatory Visit (INDEPENDENT_AMBULATORY_CARE_PROVIDER_SITE_OTHER): Payer: Medicare Other | Admitting: Physical Therapy

## 2021-04-30 ENCOUNTER — Other Ambulatory Visit: Payer: Self-pay

## 2021-04-30 DIAGNOSIS — M25521 Pain in right elbow: Secondary | ICD-10-CM | POA: Diagnosis not present

## 2021-04-30 DIAGNOSIS — M542 Cervicalgia: Secondary | ICD-10-CM | POA: Diagnosis not present

## 2021-04-30 NOTE — Therapy (Signed)
?OUTPATIENT PHYSICAL THERAPY TREATMENT   ? ? ?Patient Name: Mariah Bryant ?MRN: [id number] ?DOB:03-23-1930, 88 y.o., female ?Today's Date: 04/30/2021 ? ? PT End of Session - 04/30/21 1414   ? ? Visit Number 2   ? Number of Visits 12   ? Date for PT Re-Evaluation 06/08/21   ? Authorization Type Medicare   ? PT Start Time 1345   ? PT Stop Time 1424   ? PT Time Calculation (min) 39 min   ? ?  ?  ? ?  ? ? ? ?Past Medical History:  ?Diagnosis Date  ? Allergic rhinitis 08/15/2017  ? Asthma, stable, moderate persistent 07/17/2009  ? Atrial fibrillation ([place])   ? Brady-tachy syndrome ([place])   ? Per patient  ? 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 Chronic pain of left knee 08/16/2017  ? Depressive disorder 07/17/2009  ? Gastro-esophageal reflux disease without esophagitis 10/07/2010  ? Hiatal hernia 10/07/2010  ? Hypercholesterolemia 07/17/2009  ? Hypogammaglobulinemia ([place]) 08/15/2017  ? Macular degeneration 08/16/2017  ? Orthostatic intolerance 08/16/2017  ? Osteoarthritis 07/17/2009  ? Osteoporosis 08/16/2017  ? Seborrheic dermatitis 10/07/2010  ? Sleep apnea 07/17/2009  ? Temporomandibular joint disorder 10/07/2010  ? ?Past Surgical History:  ?Procedure Laterality Date  ? APPENDECTOMY    ? CATARACT EXTRACTION, BILATERAL    ? CHOLECYSTECTOMY    ? HYSTERECTOMY ABDOMINAL WITH SALPINGO-OOPHORECTOMY    ? REPLACEMENT TOTAL KNEE    ? ?Patient Active Problem List  ? Diagnosis Date Noted  ? Post-COVID chronic cough 03/06/2021  ? Essential tremor 08/26/2020  ? CHF (congestive heart failure) ([place]) 02/15/2020  ? Positive ANA (antinuclear antibody) 01/28/2020  ? RLS (restless legs syndrome) 08/27/2017  ? Chronic toe pain, right third toe 08/27/2017  ? 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 Macular degeneration of both eyes 08/27/2017  ? Difficulty walking 08/27/2017  ? Hearing loss, with bilateral hearing aids obtained from [facility] 08/19/2017  ? Osteoporosis, on Prolia 08/16/2017  ? Chronic pain of left  knee 08/16/2017  ? Chronic left-sided low back pain without sciatica 08/16/2017  ? Macular degeneration 08/16/2017  ? Orthostatic intolerance 08/16/2017  ? Aortic atherosclerosis ([place]) 08/16/2017  ? Degenerative arthritis of lumbar spine 08/16/2017  ? Allergic rhinitis, on Flonase prn 08/15/2017  ? PAF (paroxysmal atrial fibrillation) ([place]) 05/05/2017  ? Status post total right knee replacement 09/17/2015  ? Atrial paroxysmal tachycardia ([place]) 12/06/2012  ? Brown's syndrome, with double vision 10/07/2010  ? Hiatal hernia, without GERD 10/07/2010  ? Mitral valve prolapse 10/07/2010  ? Seborrheic dermatitis, controlled with Aloe 10/07/2010  ? Temporomandibular joint disorder, right side 10/07/2010  ? Mild persistent asthma, on daily Pulmicort 07/17/2009  ? Depressive disorder, on Lexapro 07/17/2009  ? Hypercholesterolemia, no statin 07/17/2009  ? Osteoarthritis 07/17/2009  ? ? ?PCP: Inda Coke, PA ? ?REFERRING PROVIDER: Inda Coke, PA ? ?REFERRING DIAG: R Elbow Pain ? ?THERAPY DIAG:  ?Cervicalgia ? ?Pain in right elbow ? ? ?ONSET DATE: 3 weeks ago ? ?SUBJECTIVE:                                                                                                                                                                                     ? ?  SUBJECTIVE STATEMENT: ?Pt states less pain in arm since Eval earlier this week. Also was able to sleep better with less pain last few nights.  ? ?PERTINENT HISTORY: ?OA, Osteoporosis, CHF, Macular degeneration.  ? ?PAIN:  ?Are you having pain? Yes ?NPRS scale: 7/10 ?Pain location: Elbow ?Pain orientation: Right  ?PAIN TYPE: aching "just hurts" ,not necessarily tingling  ?Pain description: intermittent  ?Aggravating factors: none stated ?Relieving factors: None stated  ? ?Are you having pain? Yes ?NPRS scale: 6-10/10 ?Pain location: Neck, R shoulder blade ?Pain orientation: Right  ?PAIN TYPE: aching ?Pain description: intermittent  ?Aggravating factors: none  stated ?Relieving factors: None stated ? ?PRECAUTIONS: None ? ?WEIGHT BEARING RESTRICTIONS No ? ?FALLS:  ?Has patient fallen in last 6 months? Yes Number of falls: 1 ?1 fall about 1 year ago, slipped when she was getting out of bed, did hit head but no injury.  ? ? ?PLOF: Independent ? ?PATIENT GOALS  Decreased pain in arm.  ? ?OBJECTIVE:  ? ?DIAGNOSTIC FINDINGS:  ?No recent imaging ? ?COGNITION: ? Overall cognitive status: Within functional limits for tasks assessed ? ?POSTURE: ?Forward head  ? ?AROM/PROM: ?04/27/21:    ?Cervical: Limited rotation: 30 deg bil, R rotation reproduces UE symptoms.  ?Shoulder: mild limitation for full flexion and ER ?Elbow: WFL ?Hand: limited finger extension, due to mild flexion contractures.  ? ? ?UPPER EXTREMITY MMT: ?04/27/21: ?Shoulders: 4/5, Elbow: 4/5 ? ? ?SHOULDER SPECIAL TESTS: ?-Cubital tunnel: Supine: Prolonged Elbow flexion does not reproduce sympotms. Seated, some increase in pain. No change in pain with elbow position change.  ?-Seated and supine: Cervical ROM, mostly R rotation, directly increases pain down R UE, returning head to neutral or doing L rotation improves symptoms. Pain worse in seated vs supine position.  ?-ULTT Neg on R  ? ? ?JOINT MOBILITY TESTING:  ?Hypomobile c-spine, minimal/mild hypomobility in GHJ ? ?PALPATION:  ?Tenderness to palpate medial elbow and ulnar groove, as well as lateral elbow and radial head. Stiffness and hypomobility in c-spine.  ?  ?TODAY'S TREATMENT:  ?04/30/21: ?Therapeutic Exercise: ?Aerobic: ?Supine: ?Seated: ?Standing: ?Stretches:  ? ?Neuromuscular Re-education: ?Manual Therapy: ?Therapeutic Activity: ?Self Care: ?Trigger Point Dry Needling:  ?Modalities:  ?  ? ? ?PATIENT EDUCATION: ?Education details: Reviewed and updated HEP ?Person educated: Patient ?Education method: Explanation, Demonstration, Tactile cues, Verbal cues, and Handouts ?Education comprehension: verbalized understanding, returned demonstration, verbal cues required,  tactile cues required, and needs further education ? ? ?HOME EXERCISE PROGRAM: ?Access Code: [email or web address] ? ? ?ASSESSMENT: ? ?CLINICAL IMPRESSION: ?04/30/21: Pt with less pain today overall, and less pain reproduced during session. She is able to perform R cervical rotation, but does get increased symptoms with increased reps. Pt to benefit from continued manual for decompression and strengthening as tolerated.  ? ? ?OBJECTIVE IMPAIRMENTS decreased activity tolerance, decreased mobility, decreased ROM, decreased strength, impaired flexibility, and pain.  ? ?ACTIVITY LIMITATIONS cleaning, community activity, driving, meal prep, laundry, and shopping.  ? ?PERSONAL FACTORS  None  are also affecting patient's functional outcome.  ? ? ?REHAB POTENTIAL: Fair  ? ?CLINICAL DECISION MAKING: Evolving/moderate complexity ? ?EVALUATION COMPLEXITY: Moderate ? ? ?GOALS: ?Goals reviewed with patient? Yes ? ?SHORT TERM GOALS: ? ?STG Name Target Date Goal status  ?1 Patient will be independent with initial HEP  ? 05/11/21 INITIAL  ?2 Pt to demo decreased pain in UE to 4/10  05/11/21 INITIAL  ?  ?    ? ?LONG TERM GOALS:  ? ?LTG Name Target Date ?6 weeks  Goal  status  ?1 Patient will be independent with final HEP  ? 06/08/21 INITIAL  ?2 Pt to report decreased pain in neck and UE  to 0-2/10  ? 06/08/21 INITIAL  ?3 Pt to demo ability for cervical ROM to be non painful, to improve ability for ADLs and IADLs.  ? 06/08/21 INITIAL  ?4 Pt to demonstrate and ability for lift, reach, and carry with R UE , without pain.  ? 06/08/21 INITIAL  ?5  ?    ? ? ? ?PLAN: ?PT FREQUENCY: 1-2x/week ? ?PT DURATION: 6 weeks ? ?PLANNED INTERVENTIONS: Therapeutic exercises, Therapeutic activity, Neuro Muscular re-education, Balance training, Patient/Family education, Joint mobilization, DME instructions, Dry Needling, Spinal mobilization, Cryotherapy, Moist heat, Taping, Traction, Ionotophoresis 4mg /ml Dexamethasone, and Manual therapy ? ?PLAN FOR NEXT SESSION:  Cervical  mobilization, posture education, postural strengthening, Distraction/ traction ? ? ?Lyndee Hensen, PT, DPT ?2:26 PM  04/30/21 ? ? ? ?

## 2021-05-01 ENCOUNTER — Other Ambulatory Visit: Payer: Self-pay | Admitting: Physician Assistant

## 2021-05-07 ENCOUNTER — Ambulatory Visit (INDEPENDENT_AMBULATORY_CARE_PROVIDER_SITE_OTHER): Payer: Medicare Other | Admitting: Physical Therapy

## 2021-05-07 DIAGNOSIS — M542 Cervicalgia: Secondary | ICD-10-CM

## 2021-05-07 DIAGNOSIS — M25521 Pain in right elbow: Secondary | ICD-10-CM

## 2021-05-07 NOTE — Therapy (Signed)
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   Chronic pain of left knee 08/16/2017   Depressive disorder 07/17/2009   Gastro-esophageal reflux disease without esophagitis 10/07/2010   Hiatal hernia 10/07/2010   Hypercholesterolemia 07/17/2009   Hypogammaglobulinemia ([place]) 08/15/2017   Macular degeneration 08/16/2017   Orthostatic intolerance 08/16/2017   Osteoarthritis 07/17/2009   Osteoporosis 08/16/2017   Seborrheic dermatitis 10/07/2010   Sleep apnea 07/17/2009   Temporomandibular joint disorder 10/07/2010   Past Surgical History:  Procedure Laterality Date   APPENDECTOMY     CATARACT EXTRACTION, BILATERAL     CHOLECYSTECTOMY     HYSTERECTOMY ABDOMINAL WITH SALPINGO-OOPHORECTOMY     REPLACEMENT TOTAL KNEE     Patient Active Problem List   Diagnosis Date Noted   Post-COVID chronic cough 03/06/2021   Essential tremor 08/26/2020   CHF (congestive heart failure) ([place]) 02/15/2020   Positive ANA (antinuclear antibody) 01/28/2020   RLS (restless legs syndrome) 08/27/2017   Chronic toe pain, right third toe 08/27/2017   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, mild stage 08/27/2017   Macular degeneration of both eyes 08/27/2017   Difficulty walking 08/27/2017   Hearing loss, with bilateral hearing aids obtained from [facility] 08/19/2017   Osteoporosis, on Prolia 08/16/2017   Chronic pain of left  knee 08/16/2017   Chronic left-sided low back pain without sciatica 08/16/2017   Macular degeneration 08/16/2017   Orthostatic intolerance 08/16/2017   Aortic atherosclerosis ([place]) 08/16/2017   Degenerative arthritis of lumbar spine 08/16/2017   Allergic rhinitis, on Flonase prn 08/15/2017   PAF (paroxysmal atrial fibrillation) ([place]) 05/05/2017   Status post total right knee replacement 09/17/2015   Atrial paroxysmal tachycardia ([place]) 12/06/2012   Brown's syndrome, with double vision 10/07/2010   Hiatal hernia, without GERD 10/07/2010   Mitral valve prolapse 10/07/2010   Seborrheic dermatitis, controlled with Aloe 10/07/2010   Temporomandibular joint disorder, right side 10/07/2010   Mild persistent asthma, on daily Pulmicort 07/17/2009   Depressive disorder, on Lexapro 07/17/2009   Hypercholesterolemia, no statin 07/17/2009   Osteoarthritis 07/17/2009    PCP: Inda Coke, PA  REFERRING PROVIDER: Inda Coke, PA  REFERRING DIAG: R Elbow Pain  THERAPY DIAG:  Cervicalgia  Pain in right elbow   ONSET DATE: 3 weeks ago  SUBJECTIVE:  SUBJECTIVE STATEMENT: Pt states less pain in arm overall. She is still feeling tenderness in elbow/unlar nerve region, as well as increased burning and tingling in arm later in evening and with sleeping.   PERTINENT HISTORY: OA, Osteoporosis, CHF, Macular degeneration.   PAIN:  Are you having pain? Yes NPRS scale: 7/10 Pain location: Elbow Pain orientation: Right  PAIN TYPE: aching "just hurts" ,not necessarily tingling  Pain description: intermittent  Aggravating factors: none stated Relieving factors: None stated   Are you having pain? Yes NPRS scale: 6-10/10 Pain location: Neck, R shoulder blade Pain orientation: Right  PAIN TYPE: aching Pain  description: intermittent  Aggravating factors: none stated Relieving factors: None stated  PRECAUTIONS: None  WEIGHT BEARING RESTRICTIONS No  FALLS:  Has patient fallen in last 6 months? Yes Number of falls: 1 1 fall about 1 year ago, slipped when she was getting out of bed, did hit head but no injury.    PLOF: Independent  PATIENT GOALS  Decreased pain in arm.   OBJECTIVE:   DIAGNOSTIC FINDINGS:  No recent imaging  COGNITION:  Overall cognitive status: Within functional limits for tasks assessed  POSTURE: Forward head   AROM/PROM: 04/27/21:    Cervical: Limited rotation: 30 deg bil, R rotation reproduces UE symptoms.  Shoulder: mild limitation for full flexion and ER Elbow: WFL Hand: limited finger extension, due to mild flexion contractures.    UPPER EXTREMITY MMT: 04/27/21: Shoulders: 4/5, Elbow: 4/5   SHOULDER SPECIAL TESTS: -Cubital tunnel: Supine: Prolonged Elbow flexion does not reproduce sympotms. Seated, some increase in pain. No change in pain with elbow position change.  -Seated and supine: Cervical ROM, mostly R rotation, directly increases pain down R UE, returning head to neutral or doing L rotation improves symptoms. Pain worse in seated vs supine position.  -ULTT Neg on R    JOINT MOBILITY TESTING:  Hypomobile c-spine, minimal/mild hypomobility in GHJ  PALPATION:  Tenderness to palpate medial elbow and ulnar groove, as well as lateral elbow and radial head. Stiffness and hypomobility in c-spine.    TODAY'S TREATMENT:  05/07/21: Therapeutic Exercise: Aerobic: Supine: Seated: Standing: Bwd shoulder rolls x 20; Rows RTB x 20; UE flex/90 deg x 10; Bicep curls x 15, 3 lb;  Stretches: Supine pec stretch x 2 min; Doorway pec stretch at 60 deg 30 sec x 3;  Neuromuscular Re-education: Manual Therapy:  Manual cervical traction 10 sec 10; Manual PROM for SB and rotation; Manual UT stretching; STM/TPR for bil cervical paraspinals. Cervical and high  thoracic PA mobs;      PATIENT EDUCATION: Education details: Reviewed and updated HEP, discussed trying altering head position if arm is uncomfortable.  Person educated: Patient Education method: Explanation, Demonstration, Tactile cues, Verbal cues, and Handouts Education comprehension: verbalized understanding, returned demonstration, verbal cues required, tactile cues required, and needs further education   HOME EXERCISE PROGRAM: Access Code: [email or web address]   ASSESSMENT:  CLINICAL IMPRESSION: 05/07/21: Pt with reproducible tingling symptoms in R UE with R cervical Rot and SB. Pt with relief with manual and traction today. Doing well with ther ex for HEP, discussed altering head/neck position if arm is painful. Discussed optimal sleeping and sitting/reading head position for decreased pain.     OBJECTIVE IMPAIRMENTS decreased activity tolerance, decreased mobility, decreased ROM, decreased strength, impaired flexibility, and pain.   ACTIVITY LIMITATIONS cleaning, community activity, driving, meal prep, laundry, and shopping.   PERSONAL FACTORS  None  are also affecting patient's functional outcome.    REHAB POTENTIAL:  Fair   CLINICAL DECISION MAKING: Evolving/moderate complexity  EVALUATION COMPLEXITY: Moderate   GOALS: Goals reviewed with patient? Yes  SHORT TERM GOALS:  STG Name Target Date Goal status  1 Patient will be independent with initial HEP   05/11/21 INITIAL  2 Pt to demo decreased pain in UE to 4/10  05/11/21 INITIAL         LONG TERM GOALS:   LTG Name Target Date 6 weeks  Goal status  1 Patient will be independent with final HEP   06/08/21 INITIAL  2 Pt to report decreased pain in neck and UE  to 0-2/10   06/08/21 INITIAL  3 Pt to demo ability for cervical ROM to be non painful, to improve ability for ADLs and IADLs.   06/08/21 INITIAL  4 Pt to demonstrate and ability for lift, reach, and carry with R UE , without pain.   06/08/21 INITIAL  5          PLAN: PT FREQUENCY: 1-2x/week  PT DURATION: 6 weeks  PLANNED INTERVENTIONS: Therapeutic exercises, Therapeutic activity, Neuro Muscular re-education, Balance training, Patient/Family education, Joint mobilization, DME instructions, Dry Needling, Spinal mobilization, Cryotherapy, Moist heat, Taping, Traction, Ionotophoresis '4mg'$ /ml Dexamethasone, and Manual therapy  PLAN FOR NEXT SESSION:  Cervical mobilization, posture education, postural strengthening, Distraction/ traction   Lyndee Hensen, PT, DPT 10:38 AM  05/08/21

## 2021-05-08 ENCOUNTER — Encounter: Payer: Self-pay | Admitting: Physical Therapy

## 2021-05-14 ENCOUNTER — Encounter: Payer: Medicare Other | Admitting: Physical Therapy

## 2021-05-14 DIAGNOSIS — S0990XA Unspecified injury of head, initial encounter: Secondary | ICD-10-CM | POA: Diagnosis not present

## 2021-05-14 DIAGNOSIS — Z7901 Long term (current) use of anticoagulants: Secondary | ICD-10-CM | POA: Diagnosis not present

## 2021-05-14 DIAGNOSIS — Z79899 Other long term (current) drug therapy: Secondary | ICD-10-CM | POA: Diagnosis not present

## 2021-05-14 DIAGNOSIS — S022XXA Fracture of nasal bones, initial encounter for closed fracture: Secondary | ICD-10-CM | POA: Diagnosis not present

## 2021-05-20 DIAGNOSIS — S022XXA Fracture of nasal bones, initial encounter for closed fracture: Secondary | ICD-10-CM | POA: Diagnosis not present

## 2021-05-21 ENCOUNTER — Encounter: Payer: Medicare Other | Admitting: Physical Therapy

## 2021-05-26 DIAGNOSIS — H353221 Exudative age-related macular degeneration, left eye, with active choroidal neovascularization: Secondary | ICD-10-CM | POA: Diagnosis not present

## 2021-05-26 DIAGNOSIS — H18523 Epithelial (juvenile) corneal dystrophy, bilateral: Secondary | ICD-10-CM | POA: Diagnosis not present

## 2021-05-26 DIAGNOSIS — H04123 Dry eye syndrome of bilateral lacrimal glands: Secondary | ICD-10-CM | POA: Diagnosis not present

## 2021-06-08 ENCOUNTER — Other Ambulatory Visit: Payer: Self-pay | Admitting: Family Medicine

## 2021-06-10 ENCOUNTER — Other Ambulatory Visit: Payer: Self-pay | Admitting: Physician Assistant

## 2021-06-10 NOTE — Telephone Encounter (Signed)
Out of [place] with son ? ?.. ?Encourage patient to contact the pharmacy for refills or they can request refills through [facility] ? ?LAST APPOINTMENT DATE:   ?02.24.23 ? ?NEXT APPOINTMENT DATE: ?Not Available ? ?MEDICATION: ?ALPRAZolam (XANAX) 0.25 MG tablet [[id number]]  ? ? ? ?Is the patient out of medication?  ?Yes ? ?[facility] [facility] [address] ?[phone] ? ? ?Let patient know to contact pharmacy at the end of the day to make sure medication is ready. ? ?Please notify patient to allow 48-72 hours to process  ?

## 2021-06-11 NOTE — Telephone Encounter (Signed)
Left message on voicemail to call office.  

## 2021-06-12 ENCOUNTER — Telehealth: Payer: Self-pay

## 2021-06-12 DIAGNOSIS — M810 Age-related osteoporosis without current pathological fracture: Secondary | ICD-10-CM

## 2021-06-12 NOTE — Telephone Encounter (Addendum)
Last Prolia inj 04/02/21 ?Next Prolia inj due 10/01/21 ?

## 2021-06-12 NOTE — Telephone Encounter (Signed)
Last Prolia inj 04/02/21 ?Next Prolia inj due 10/01/21 ?

## 2021-06-15 DIAGNOSIS — H353221 Exudative age-related macular degeneration, left eye, with active choroidal neovascularization: Secondary | ICD-10-CM | POA: Diagnosis not present

## 2021-06-17 NOTE — Telephone Encounter (Signed)
Any update on this?

## 2021-06-17 NOTE — Telephone Encounter (Signed)
Tried to contact pt again voicemail box is full unable to leave message. If patient calls back we need to know how long she is going to be with her son so we can send in enough medication? ?

## 2021-06-22 MED ORDER — ALPRAZOLAM 0.25 MG PO TABS: 0.2500 mg | ORAL_TABLET | Freq: Two times a day (BID) | ORAL | 0 refills | Status: DC | PRN

## 2021-06-22 NOTE — Telephone Encounter (Signed)
Pt states she has been taking half a pill at night, unless she's having a bad day, she will take a whole. ? ?Pt states she will be in [place] until August 13, 2021 ?

## 2021-06-25 NOTE — Telephone Encounter (Deleted)
error 

## 2021-07-13 DIAGNOSIS — H26492 Other secondary cataract, left eye: Secondary | ICD-10-CM | POA: Diagnosis not present

## 2021-07-19 ENCOUNTER — Other Ambulatory Visit: Payer: Self-pay | Admitting: Physician Assistant

## 2021-07-27 ENCOUNTER — Other Ambulatory Visit: Payer: Self-pay | Admitting: Internal Medicine

## 2021-07-28 NOTE — Telephone Encounter (Signed)
Prescription refill request for Eliquis received. Indication: afib  Last office visit: Caryl Comes, 10/07/2020 Scr: 0.82, 02/24/2021 Age: 88 yo  Weight: 62.3 kg   Refill sent.

## 2021-07-30 DIAGNOSIS — Z9889 Other specified postprocedural states: Secondary | ICD-10-CM | POA: Diagnosis not present

## 2021-07-30 DIAGNOSIS — Z961 Presence of intraocular lens: Secondary | ICD-10-CM | POA: Diagnosis not present

## 2021-07-30 DIAGNOSIS — H527 Unspecified disorder of refraction: Secondary | ICD-10-CM | POA: Diagnosis not present

## 2021-08-06 ENCOUNTER — Other Ambulatory Visit: Payer: Self-pay | Admitting: Physician Assistant

## 2021-08-11 ENCOUNTER — Other Ambulatory Visit: Payer: Self-pay | Admitting: *Deleted

## 2021-08-11 MED ORDER — ALPRAZOLAM 0.5 MG PO TABS: ORAL_TABLET | ORAL | 0 refills | Status: DC

## 2021-08-11 NOTE — Telephone Encounter (Signed)
Received fax from Pharmacy Alprazolam 0.25 mg is on backorder, alternative is 0.5 mg. Cart loaded with correct pharmacy.

## 2021-08-29 DIAGNOSIS — R9431 Abnormal electrocardiogram [ECG] [EKG]: Secondary | ICD-10-CM | POA: Diagnosis not present

## 2021-08-29 DIAGNOSIS — J040 Acute laryngitis: Secondary | ICD-10-CM | POA: Diagnosis not present

## 2021-08-29 DIAGNOSIS — Z20822 Contact with and (suspected) exposure to covid-19: Secondary | ICD-10-CM | POA: Diagnosis not present

## 2021-08-29 DIAGNOSIS — J060 Acute laryngopharyngitis: Secondary | ICD-10-CM | POA: Diagnosis not present

## 2021-08-29 DIAGNOSIS — J029 Acute pharyngitis, unspecified: Secondary | ICD-10-CM | POA: Diagnosis not present

## 2021-08-29 DIAGNOSIS — R0602 Shortness of breath: Secondary | ICD-10-CM | POA: Diagnosis not present

## 2021-08-29 NOTE — Telephone Encounter (Signed)
Prolia VOB initiated via [email or web address]  Last Prolia inj 04/02/21 Next Prolia inj due 10/01/21

## 2021-09-07 NOTE — Telephone Encounter (Signed)
UNICARE (secondary)  Prior auth required for [employer]  PA PROCESS DETAILS: Prior Authorization is Required. We are unable to confirm if a PA is on file. Please check your records or contact the payer.

## 2021-09-11 ENCOUNTER — Telehealth: Payer: Self-pay | Admitting: Physician Assistant

## 2021-09-11 NOTE — Telephone Encounter (Signed)
Copied from [place] [id number]. Topic: Medicare AWV >> Sep 11, 2021  2:00 PM Devoria Glassing wrote: Reason for CRM: Called patient to schedule Annual Wellness Visit.  Please schedule with Nurse Health Advisor Charlott Rakes, RN at [facility]. This appt can be telephone or office visit. Please call [phone] ask for [facility]

## 2021-09-29 NOTE — Telephone Encounter (Signed)
Unable to verify pt via CoverMyMeds.   Waiting for [facility] login approval.   Prior auth initiated via fax.

## 2021-09-30 ENCOUNTER — Other Ambulatory Visit: Payer: Self-pay

## 2021-09-30 MED ORDER — AMIODARONE HCL 200 MG PO TABS: 200.0000 mg | ORAL_TABLET | Freq: Every day | ORAL | 0 refills | Status: DC

## 2021-10-01 ENCOUNTER — Ambulatory Visit: Payer: Medicare Other

## 2021-10-08 NOTE — Telephone Encounter (Signed)
Member's Primary Insurance is listed as MEDICARE A. Please contact primary insurance.

## 2021-10-10 MED ORDER — DENOSUMAB 60 MG/ML ~~LOC~~ SOSY: 60.0000 mg | PREFILLED_SYRINGE | SUBCUTANEOUS | 1 refills | Status: AC

## 2021-10-10 NOTE — Telephone Encounter (Addendum)
*  Specialty Pharmacy Co-pay enrollment [facility]  Prior Authorization initiated for [place] via [email or web address] KEY: [id number]

## 2021-10-10 NOTE — Addendum Note (Signed)
Addended by: Jasper Loser on: 10/10/2021 09:15 AM   Modules accepted: Orders

## 2021-10-23 ENCOUNTER — Telehealth: Payer: Self-pay | Admitting: Physician Assistant

## 2021-10-23 NOTE — Telephone Encounter (Signed)
Pt is sch for prolia on 11/19/21 at2pm - pt to pay?

## 2021-11-16 DIAGNOSIS — H353221 Exudative age-related macular degeneration, left eye, with active choroidal neovascularization: Secondary | ICD-10-CM | POA: Diagnosis not present

## 2021-11-16 DIAGNOSIS — H353231 Exudative age-related macular degeneration, bilateral, with active choroidal neovascularization: Secondary | ICD-10-CM | POA: Diagnosis not present

## 2021-11-17 ENCOUNTER — Other Ambulatory Visit: Payer: Self-pay | Admitting: Physician Assistant

## 2021-11-19 ENCOUNTER — Other Ambulatory Visit: Payer: Self-pay | Admitting: Internal Medicine

## 2021-11-19 ENCOUNTER — Ambulatory Visit: Payer: Medicare Other

## 2021-11-19 ENCOUNTER — Other Ambulatory Visit: Payer: Self-pay | Admitting: Student

## 2021-11-19 NOTE — Telephone Encounter (Signed)
Prescription refill request for Mariah Bryant received. Indication: afib  Last office visit: Mariah Bryant, 10/07/2020 Scr: 0.88, 08/29/2021 Age: 88 yo  Weight: 62.3 kg   Pt is overdue for an office visit. Sent msg to to schedulers about pt needing appointment.   Per Dr. Olin Bryant note from 09/2020 he mentioned pt switching to Xarelto once pt came back from [place].   Called and spoke to pt's daughter Mariah Bryant on [place])  and made her aware that pt will need yearly follow up visit with Dr. Caryl Bryant to continue refilling Mariah Bryant.  Daughter stated that pt is currently in [place] and will call the office later to make an appointment because she is about to step into a meeting.   Followed up with daughter conerning pt's tremors and how Dr Mariah Bryant had mentioned in his note from (August 2022) about pt switching to Xarelto. Daughter stated that she did not remember and needs to talk to her mom. Informed her that I would send in refill for Mariah Bryant so pt does not run out and any medication changes to would be done once pt sees Dr. Caryl Bryant at follow up visit.

## 2021-11-23 ENCOUNTER — Encounter: Payer: Self-pay | Admitting: *Deleted

## 2021-11-23 NOTE — Telephone Encounter (Signed)
Hi Keba and Butch Penny,  I've tried multiple times to do ePA for Prolia for pt with unicare without success.   Will you please contact insurance company by phone to do PA or request fax form that I can fill out?  Thanks,   [employer]

## 2021-11-23 NOTE — Telephone Encounter (Signed)
Hasna, can you please look into this. I have no idea how to do this.

## 2021-11-26 ENCOUNTER — Telehealth: Payer: Self-pay | Admitting: Internal Medicine

## 2021-11-26 ENCOUNTER — Encounter: Payer: Self-pay | Admitting: Internal Medicine

## 2021-11-26 NOTE — Telephone Encounter (Signed)
Patient contacted 3x to schedule overdue f/u with no success in scheduling. Will send reminder letter.

## 2021-11-26 NOTE — Telephone Encounter (Signed)
Here is what was submitted:

## 2021-12-07 ENCOUNTER — Other Ambulatory Visit: Payer: Self-pay | Admitting: Physician Assistant

## 2021-12-24 DIAGNOSIS — H353231 Exudative age-related macular degeneration, bilateral, with active choroidal neovascularization: Secondary | ICD-10-CM | POA: Diagnosis not present

## 2021-12-29 ENCOUNTER — Telehealth: Payer: Self-pay

## 2021-12-29 NOTE — Telephone Encounter (Signed)
Last Prolia injection wasa scheduled for 11/19/21

## 2021-12-30 NOTE — Telephone Encounter (Signed)
error 

## 2022-01-03 ENCOUNTER — Other Ambulatory Visit: Payer: Self-pay | Admitting: Internal Medicine

## 2022-01-03 ENCOUNTER — Other Ambulatory Visit: Payer: Self-pay | Admitting: Physician Assistant

## 2022-01-04 NOTE — Telephone Encounter (Signed)
Prescription refill request for Eliquis received. Indication:afib Last office visit:needs cardiology appt Scr:0.8 Age: 88 Weight:62.3 kg  Prescription refilled

## 2022-01-04 NOTE — Telephone Encounter (Signed)
Pt requesting refill for Alprazolam 0.5 mg. Last OV 04/24/2021.

## 2022-01-20 ENCOUNTER — Other Ambulatory Visit: Payer: Self-pay | Admitting: Internal Medicine

## 2022-01-27 DIAGNOSIS — H353231 Exudative age-related macular degeneration, bilateral, with active choroidal neovascularization: Secondary | ICD-10-CM | POA: Diagnosis not present

## 2022-02-04 DIAGNOSIS — J4530 Mild persistent asthma, uncomplicated: Secondary | ICD-10-CM | POA: Diagnosis not present

## 2022-02-04 DIAGNOSIS — I480 Paroxysmal atrial fibrillation: Secondary | ICD-10-CM | POA: Diagnosis not present

## 2022-02-04 DIAGNOSIS — I495 Sick sinus syndrome: Secondary | ICD-10-CM | POA: Diagnosis not present

## 2022-02-04 DIAGNOSIS — G250 Essential tremor: Secondary | ICD-10-CM | POA: Diagnosis not present

## 2022-02-04 DIAGNOSIS — I5032 Chronic diastolic (congestive) heart failure: Secondary | ICD-10-CM | POA: Diagnosis not present

## 2022-02-04 DIAGNOSIS — I951 Orthostatic hypotension: Secondary | ICD-10-CM | POA: Diagnosis not present

## 2022-02-04 DIAGNOSIS — E785 Hyperlipidemia, unspecified: Secondary | ICD-10-CM | POA: Diagnosis not present

## 2022-02-05 DIAGNOSIS — J4520 Mild intermittent asthma, uncomplicated: Secondary | ICD-10-CM | POA: Diagnosis not present

## 2022-02-05 DIAGNOSIS — R0989 Other specified symptoms and signs involving the circulatory and respiratory systems: Secondary | ICD-10-CM | POA: Diagnosis not present

## 2022-02-07 DIAGNOSIS — I480 Paroxysmal atrial fibrillation: Secondary | ICD-10-CM | POA: Diagnosis not present

## 2022-02-08 ENCOUNTER — Other Ambulatory Visit: Payer: Self-pay | Admitting: Physician Assistant

## 2022-02-08 ENCOUNTER — Other Ambulatory Visit: Payer: Self-pay | Admitting: Internal Medicine

## 2022-02-09 NOTE — Telephone Encounter (Signed)
Eliquis '5mg'$  refill request received. Patient is 88 years old, weight-62.3kg, Crea-0.82 on 02/24/2021, Diagnosis-Afib, and last seen by Dr. Caryl Comes on  10/07/2020. Dose is appropriate based on dosing criteria.   Per Care Everywhere pt had an appt at [facility] in [place], [place].

## 2022-02-11 ENCOUNTER — Encounter: Payer: Self-pay | Admitting: *Deleted

## 2022-03-16 DIAGNOSIS — H35371 Puckering of macula, right eye: Secondary | ICD-10-CM | POA: Diagnosis not present

## 2022-03-16 DIAGNOSIS — D3131 Benign neoplasm of right choroid: Secondary | ICD-10-CM | POA: Diagnosis not present

## 2022-03-16 DIAGNOSIS — H353231 Exudative age-related macular degeneration, bilateral, with active choroidal neovascularization: Secondary | ICD-10-CM | POA: Diagnosis not present

## 2022-03-16 DIAGNOSIS — H43813 Vitreous degeneration, bilateral: Secondary | ICD-10-CM | POA: Diagnosis not present

## 2022-03-18 DIAGNOSIS — Z23 Encounter for immunization: Secondary | ICD-10-CM | POA: Diagnosis not present

## 2022-03-19 ENCOUNTER — Telehealth: Payer: Self-pay | Admitting: *Deleted

## 2022-03-19 NOTE — Patient Outreach (Signed)
  Care Coordination   03/19/2022 Name: Mariah Bryant MRN: [id number] DOB: 02/08/1931   Care Coordination Outreach Attempts:  An unsuccessful telephone outreach was attempted today to offer the patient information about available care coordination services as a benefit of their health plan.   Follow Up Plan:  Additional outreach attempts will be made to offer the patient care coordination information and services.   Encounter Outcome:  No Answer   Care Coordination Interventions:  No, not indicated    Raina Mina, RN Care Management Coordinator [place] Office [phone]

## 2022-04-22 ENCOUNTER — Other Ambulatory Visit (HOSPITAL_COMMUNITY): Payer: Self-pay

## 2022-04-22 NOTE — Telephone Encounter (Signed)
Prolia VOB initiated via [email or web address] on 04/22/22  Status is in progress

## 2022-04-23 ENCOUNTER — Other Ambulatory Visit (HOSPITAL_COMMUNITY): Payer: Self-pay

## 2022-04-23 NOTE — Telephone Encounter (Signed)
Pt ready for scheduling for Prolia on or after : 04/23/22  Out-of-pocket cost due at time of visit: $0  Primary: [employer] Prolia co-insurance: 0% Admin fee co-insurance: 0%  Secondary: [employer] - [employer] Prolia co-insurance: 0% Admin fee co-insurance: 0%  Medical Benefit Details: Date Benefits were checked: 04/22/22 Deductible: $240 ($240 met)/ Coinsurance: 0%/ Admin Fee: 0%  Prior Auth: not required PA# Expiration Date:    Pharmacy benefit: Copay $90 If patient wants fill through the pharmacy benefit please send prescription to:  [facility] , and include estimated need by date in rx notes. Pharmacy will ship medication directly to the office.  Patient NOT eligible for Prolia Copay Card. Copay Card can make patient's cost as little as $25. Link to apply: [email or web address]://[email or web address]  ** This summary of benefits is an estimation of the patient's out-of-pocket cost. Exact cost may very based on individual plan coverage.

## 2022-04-26 NOTE — Telephone Encounter (Signed)
Please call pt and schedule Nurse Visit for Prolia injection. No copay.

## 2022-04-26 NOTE — Telephone Encounter (Signed)
Hasna, do I need to order patient a Prolia injection?

## 2022-06-04 DIAGNOSIS — H353231 Exudative age-related macular degeneration, bilateral, with active choroidal neovascularization: Secondary | ICD-10-CM | POA: Diagnosis not present

## 2022-06-15 NOTE — Progress Notes (Signed)
Mariah Bryant is a 89 y.o. female here for a medication follow-up.  History of Present Illness:   No chief complaint on file.   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   Chronic pain of left knee 08/16/2017   Depressive disorder 07/17/2009   Gastro-esophageal reflux disease without esophagitis 10/07/2010   Hiatal hernia 10/07/2010   Hypercholesterolemia 07/17/2009   Hypogammaglobulinemia (HCC) 08/15/2017   Macular degeneration 08/16/2017   Orthostatic intolerance 08/16/2017   Osteoarthritis 07/17/2009   Osteoporosis 08/16/2017   Seborrheic dermatitis 10/07/2010   Sleep apnea 07/17/2009   Temporomandibular joint disorder 10/07/2010     Social History   Tobacco Use   Smoking status: Never   Smokeless tobacco: Never  Vaping Use   Vaping Use: Never used  Substance Use Topics   Alcohol use: Yes    Alcohol/week: 1.0 standard drink of alcohol    Types: 1 Glasses of wine per week    Comment: occasionally   Drug use: Never    Past Surgical History:  Procedure Laterality Date   APPENDECTOMY     CATARACT EXTRACTION, BILATERAL     CHOLECYSTECTOMY     HYSTERECTOMY ABDOMINAL WITH SALPINGO-OOPHORECTOMY     REPLACEMENT TOTAL KNEE      Family History  Problem Relation Age of Onset   Breast cancer Mother 28   Stroke Mother    Arthritis Mother    Irregular heart beat Father    Cancer Sister    Congestive Heart Failure Paternal Grandmother    Cancer Brother    Breast cancer Niece 97    Allergies  Allergen Reactions   Amoxicillin Rash   Epinephrine Palpitations   Shellfish Allergy Anaphylaxis   Morphine And Related Other (See Comments)    Irregular heart beat   Atarax [Hydroxyzine] Anxiety   Biaxin [Clarithromycin] Rash   Codeine Palpitations   Doxycycline Rash   Novocain [Procaine]  Palpitations   Penicillins Rash    Current Medications:   Current Outpatient Medications:    albuterol (VENTOLIN HFA) 108 (90 Base) MCG/ACT inhaler, Inhale 2 puffs into the lungs every 6 (six) hours as needed for wheezing or shortness of breath. For current cough, use at least in am and before bedtime until resolved then prn (Patient not taking: Reported on 04/24/2021), Disp: 18 g, Rfl: 0   ALPRAZolam (XANAX) 0.5 MG tablet, TAKE 1/2 TABLET OF 0.5 MG BY MOUTH TWO TIMES DAILY AS NEEDED FOR ANXIETY OR SLEEP, Disp: 30 tablet, Rfl: 0   amiodarone (PACERONE) 200 MG tablet, Take 1 tablet (200 mg total) by mouth daily. Call and make appt to keep getting refills. [phone] attempt., Disp: 30 tablet, Rfl: 0   apixaban (ELIQUIS) 5 MG TABS tablet, Take 1 tablet (5 mg total) by mouth 2 (two) times daily. NEEDS CARDIOLOGY APPT FOR REFILLS, CALL OFFICE, Disp: 60 tablet, Rfl: 0   benzonatate (TESSALON) 100 MG capsule, Take 1 capsule (100 mg total) by mouth 3 (three) times daily as needed for cough. (Patient not taking: Reported on 04/24/2021), Disp: 30 capsule, Rfl: 1   budesonide (PULMICORT) 180 MCG/ACT inhaler, Take 2 puffs by mouth 2 (two) times daily as needed. (Patient not taking: Reported on 04/24/2021), Disp: , Rfl:    Cholecalciferol 25 MCG (1000 UT) tablet, Take by mouth  daily., Disp: , Rfl:    denosumab (PROLIA) 60 MG/ML SOSY injection, Inject 60 mg into the skin every 6 (six) months., Disp: 180 mL, Rfl: 1   escitalopram (LEXAPRO) 10 MG tablet, TAKE 1 AND 1/2 TABLETS BY MOUTH DAILY, Disp: 135 tablet, Rfl: 0   fluticasone (FLONASE) 50 MCG/ACT nasal spray, Place 2 sprays into both nostrils 2 (two) times daily as needed for allergies or rhinitis., Disp: , Rfl:    furosemide (LASIX) 20 MG tablet, TAKE 1 TABLET BY MOUTH EVERY DAY, Disp: 30 tablet, Rfl: 0   Multiple Vitamins-Minerals (PRESERVISION AREDS 2 PO), Take 1 tablet by mouth 2 (two) times daily., Disp: , Rfl:    ondansetron (ZOFRAN-ODT) 4 MG  disintegrating tablet, Take 1 tablet (4 mg total) by mouth every 8 (eight) hours as needed for nausea or vomiting. (Patient not taking: Reported on 04/24/2021), Disp: 20 tablet, Rfl: 0   Review of Systems:   ROS  Vitals:   There were no vitals filed for this visit.   There is no height or weight on file to calculate BMI.  Physical Exam:   Physical Exam  Assessment and Plan:   ***   I,Alexander Ruley,acting as a scribe for Jarold Motto, PA.,have documented all relevant documentation on the behalf of Jarold Motto, PA,as directed by  Jarold Motto, PA while in the presence of Jarold Motto, [place].   ***   Jarold Motto, PA-C

## 2022-06-16 ENCOUNTER — Ambulatory Visit (INDEPENDENT_AMBULATORY_CARE_PROVIDER_SITE_OTHER): Payer: Medicare Other | Admitting: Physician Assistant

## 2022-06-16 ENCOUNTER — Encounter: Payer: Self-pay | Admitting: Physician Assistant

## 2022-06-16 VITALS — BP 100/60 | HR 58 | Temp 97.5°F | Ht 60.0 in | Wt 137.4 lb

## 2022-06-16 DIAGNOSIS — F419 Anxiety disorder, unspecified: Secondary | ICD-10-CM

## 2022-06-16 DIAGNOSIS — R61 Generalized hyperhidrosis: Secondary | ICD-10-CM | POA: Diagnosis not present

## 2022-06-16 DIAGNOSIS — F32A Depression, unspecified: Secondary | ICD-10-CM

## 2022-06-16 LAB — COMPREHENSIVE METABOLIC PANEL
ALT: 12 U/L (ref 0–35)
AST: 14 U/L (ref 0–37)
Albumin: 3.9 g/dL (ref 3.5–5.2)
Alkaline Phosphatase: 73 U/L (ref 39–117)
BUN: 17 mg/dL (ref 6–23)
CO2: 32 mEq/L (ref 19–32)
Calcium: 9 mg/dL (ref 8.4–10.5)
Chloride: 101 mEq/L (ref 96–112)
Creatinine, Ser: 0.95 mg/dL (ref 0.40–1.20)
GFR: 52.95 mL/min — ABNORMAL LOW (ref >60.00)
Glucose, Bld: 70 mg/dL (ref 70–99)
Potassium: 4 mEq/L (ref 3.5–5.1)
Sodium: 141 mEq/L (ref 135–145)
Total Bilirubin: 0.5 mg/dL (ref 0.2–1.2)
Total Protein: 5.9 g/dL — ABNORMAL LOW (ref 6.0–8.3)

## 2022-06-16 LAB — CBC WITH DIFFERENTIAL/PLATELET
Basophils Absolute: 0.1 10*3/uL (ref 0.0–0.1)
Basophils Relative: 1.8 % (ref 0.0–3.0)
Eosinophils Absolute: 0.1 10*3/uL (ref 0.0–0.7)
Eosinophils Relative: 1.5 % (ref 0.0–5.0)
HCT: 41.9 % (ref 36.0–46.0)
Hemoglobin: 13.8 g/dL (ref 12.0–15.0)
Lymphocytes Relative: 32.8 % (ref 12.0–46.0)
Lymphs Abs: 2 10*3/uL (ref 0.7–4.0)
MCHC: 32.9 g/dL (ref 30.0–36.0)
MCV: 90.5 fl (ref 78.0–100.0)
Monocytes Absolute: 0.6 10*3/uL (ref 0.1–1.0)
Monocytes Relative: 9.3 % (ref 3.0–12.0)
Neutro Abs: 3.3 10*3/uL (ref 1.4–7.7)
Neutrophils Relative %: 54.6 % (ref 43.0–77.0)
Platelets: 221 10*3/uL (ref 150.0–400.0)
RBC: 4.63 Mil/uL (ref 3.87–5.11)
RDW: 13.7 % (ref 11.5–15.5)
WBC: 6.1 10*3/uL (ref 4.0–10.5)

## 2022-06-16 LAB — TSH: TSH: 3.21 u[IU]/mL (ref 0.35–5.50)

## 2022-06-16 MED ORDER — ESCITALOPRAM OXALATE 10 MG PO TABS: 10.0000 mg | ORAL_TABLET | Freq: Every day | ORAL | 1 refills | Status: DC

## 2022-06-16 MED ORDER — ALPRAZOLAM 0.25 MG PO TABS: ORAL_TABLET | ORAL | 0 refills | Status: AC

## 2022-06-16 NOTE — Patient Instructions (Addendum)
It was great to see you!  I have refilled your meds  Follow-up with new provider regarding night sweats if they persist  Take care,  Jarold Motto PA-C

## 2022-06-26 DIAGNOSIS — W540XXA Bitten by dog, initial encounter: Secondary | ICD-10-CM | POA: Diagnosis not present

## 2022-06-26 DIAGNOSIS — L03113 Cellulitis of right upper limb: Secondary | ICD-10-CM | POA: Diagnosis not present

## 2022-06-26 DIAGNOSIS — Z6820 Body mass index (BMI) 20.0-20.9, adult: Secondary | ICD-10-CM | POA: Diagnosis not present

## 2022-06-28 ENCOUNTER — Telehealth: Payer: Self-pay | Admitting: Internal Medicine

## 2022-06-28 NOTE — Telephone Encounter (Signed)
Patient has been contacted 3x with no success in scheduling overdue f/u. Letter has been sent.

## 2022-06-28 NOTE — Telephone Encounter (Signed)
LVM 3x with no success in scheduling, sending letter via MyChart to have pt contact office.

## 2022-08-04 DIAGNOSIS — I480 Paroxysmal atrial fibrillation: Secondary | ICD-10-CM | POA: Diagnosis not present

## 2022-08-04 DIAGNOSIS — I5032 Chronic diastolic (congestive) heart failure: Secondary | ICD-10-CM | POA: Diagnosis not present

## 2022-08-04 DIAGNOSIS — I951 Orthostatic hypotension: Secondary | ICD-10-CM | POA: Diagnosis not present

## 2022-08-11 DIAGNOSIS — I495 Sick sinus syndrome: Secondary | ICD-10-CM | POA: Diagnosis not present

## 2022-08-11 DIAGNOSIS — I5032 Chronic diastolic (congestive) heart failure: Secondary | ICD-10-CM | POA: Diagnosis not present

## 2022-08-11 DIAGNOSIS — G250 Essential tremor: Secondary | ICD-10-CM | POA: Diagnosis not present

## 2022-08-11 DIAGNOSIS — I951 Orthostatic hypotension: Secondary | ICD-10-CM | POA: Diagnosis not present

## 2022-08-11 DIAGNOSIS — I480 Paroxysmal atrial fibrillation: Secondary | ICD-10-CM | POA: Diagnosis not present

## 2022-08-11 DIAGNOSIS — J4530 Mild persistent asthma, uncomplicated: Secondary | ICD-10-CM | POA: Diagnosis not present

## 2022-08-16 DIAGNOSIS — H353231 Exudative age-related macular degeneration, bilateral, with active choroidal neovascularization: Secondary | ICD-10-CM | POA: Diagnosis not present

## 2022-08-28 ENCOUNTER — Other Ambulatory Visit: Payer: Self-pay | Admitting: Internal Medicine

## 2022-09-16 DIAGNOSIS — D485 Neoplasm of uncertain behavior of skin: Secondary | ICD-10-CM | POA: Diagnosis not present

## 2022-09-16 DIAGNOSIS — C44622 Squamous cell carcinoma of skin of right upper limb, including shoulder: Secondary | ICD-10-CM | POA: Diagnosis not present

## 2022-09-29 DIAGNOSIS — K2100 Gastro-esophageal reflux disease with esophagitis, without bleeding: Secondary | ICD-10-CM | POA: Diagnosis not present

## 2022-09-29 DIAGNOSIS — H3530 Unspecified macular degeneration: Secondary | ICD-10-CM | POA: Diagnosis not present

## 2022-09-29 DIAGNOSIS — I480 Paroxysmal atrial fibrillation: Secondary | ICD-10-CM | POA: Diagnosis not present

## 2022-09-29 DIAGNOSIS — Z0000 Encounter for general adult medical examination without abnormal findings: Secondary | ICD-10-CM | POA: Diagnosis not present

## 2022-09-29 DIAGNOSIS — I5030 Unspecified diastolic (congestive) heart failure: Secondary | ICD-10-CM | POA: Diagnosis not present

## 2022-11-10 DIAGNOSIS — I700 Atherosclerosis of aorta: Secondary | ICD-10-CM | POA: Diagnosis not present

## 2022-11-10 DIAGNOSIS — Z9049 Acquired absence of other specified parts of digestive tract: Secondary | ICD-10-CM | POA: Diagnosis not present

## 2022-11-10 DIAGNOSIS — R1013 Epigastric pain: Secondary | ICD-10-CM | POA: Diagnosis not present

## 2022-11-11 DIAGNOSIS — C44622 Squamous cell carcinoma of skin of right upper limb, including shoulder: Secondary | ICD-10-CM | POA: Diagnosis not present

## 2022-11-25 DIAGNOSIS — H353231 Exudative age-related macular degeneration, bilateral, with active choroidal neovascularization: Secondary | ICD-10-CM | POA: Diagnosis not present

## 2022-11-26 DIAGNOSIS — R1319 Other dysphagia: Secondary | ICD-10-CM | POA: Diagnosis not present

## 2022-11-26 DIAGNOSIS — R1013 Epigastric pain: Secondary | ICD-10-CM | POA: Diagnosis not present

## 2023-01-05 DIAGNOSIS — R051 Acute cough: Secondary | ICD-10-CM | POA: Diagnosis not present

## 2023-01-05 DIAGNOSIS — R0982 Postnasal drip: Secondary | ICD-10-CM | POA: Diagnosis not present

## 2023-01-14 DIAGNOSIS — R0982 Postnasal drip: Secondary | ICD-10-CM | POA: Diagnosis not present

## 2023-01-14 DIAGNOSIS — R051 Acute cough: Secondary | ICD-10-CM | POA: Diagnosis not present

## 2023-02-04 DIAGNOSIS — J4520 Mild intermittent asthma, uncomplicated: Secondary | ICD-10-CM | POA: Diagnosis not present

## 2023-03-01 ENCOUNTER — Other Ambulatory Visit: Payer: Self-pay | Admitting: Physician Assistant

## 2023-03-29 DIAGNOSIS — D3131 Benign neoplasm of right choroid: Secondary | ICD-10-CM | POA: Diagnosis not present

## 2023-03-29 DIAGNOSIS — H353231 Exudative age-related macular degeneration, bilateral, with active choroidal neovascularization: Secondary | ICD-10-CM | POA: Diagnosis not present

## 2023-03-29 DIAGNOSIS — H35371 Puckering of macula, right eye: Secondary | ICD-10-CM | POA: Diagnosis not present

## 2023-03-29 DIAGNOSIS — H43813 Vitreous degeneration, bilateral: Secondary | ICD-10-CM | POA: Diagnosis not present

## 2023-05-16 ENCOUNTER — Ambulatory Visit (INDEPENDENT_AMBULATORY_CARE_PROVIDER_SITE_OTHER): Admitting: Physician Assistant

## 2023-05-16 ENCOUNTER — Other Ambulatory Visit: Payer: Self-pay | Admitting: Physician Assistant

## 2023-05-16 ENCOUNTER — Encounter: Payer: Self-pay | Admitting: Physician Assistant

## 2023-05-16 ENCOUNTER — Telehealth: Payer: Self-pay | Admitting: Physician Assistant

## 2023-05-16 VITALS — BP 110/60 | HR 59 | Temp 98.4°F | Ht 60.0 in | Wt 135.0 lb

## 2023-05-16 DIAGNOSIS — Z711 Person with feared health complaint in whom no diagnosis is made: Secondary | ICD-10-CM

## 2023-05-16 DIAGNOSIS — N644 Mastodynia: Secondary | ICD-10-CM

## 2023-05-16 DIAGNOSIS — M810 Age-related osteoporosis without current pathological fracture: Secondary | ICD-10-CM | POA: Diagnosis not present

## 2023-05-16 DIAGNOSIS — E7800 Pure hypercholesterolemia, unspecified: Secondary | ICD-10-CM

## 2023-05-16 DIAGNOSIS — F32A Depression, unspecified: Secondary | ICD-10-CM | POA: Diagnosis not present

## 2023-05-16 DIAGNOSIS — G250 Essential tremor: Secondary | ICD-10-CM

## 2023-05-16 DIAGNOSIS — Z23 Encounter for immunization: Secondary | ICD-10-CM | POA: Diagnosis not present

## 2023-05-16 LAB — CBC WITH DIFFERENTIAL/PLATELET
Basophils Absolute: 0.1 10*3/uL (ref 0.0–0.1)
Basophils Relative: 1 % (ref 0.0–3.0)
Eosinophils Absolute: 0.1 10*3/uL (ref 0.0–0.7)
Eosinophils Relative: 0.8 % (ref 0.0–5.0)
HCT: 44.4 % (ref 36.0–46.0)
Hemoglobin: 14.6 g/dL (ref 12.0–15.0)
Lymphocytes Relative: 23 % (ref 12.0–46.0)
Lymphs Abs: 1.6 10*3/uL (ref 0.7–4.0)
MCHC: 32.9 g/dL (ref 30.0–36.0)
MCV: 91.1 fl (ref 78.0–100.0)
Monocytes Absolute: 0.6 10*3/uL (ref 0.1–1.0)
Monocytes Relative: 7.9 % (ref 3.0–12.0)
Neutro Abs: 4.7 10*3/uL (ref 1.4–7.7)
Neutrophils Relative %: 67.3 % (ref 43.0–77.0)
Platelets: 248 10*3/uL (ref 150.0–400.0)
RBC: 4.88 Mil/uL (ref 3.87–5.11)
RDW: 14.1 % (ref 11.5–15.5)
WBC: 7 10*3/uL (ref 4.0–10.5)

## 2023-05-16 LAB — COMPREHENSIVE METABOLIC PANEL
ALT: 14 U/L (ref 0–35)
AST: 15 U/L (ref 0–37)
Albumin: 4.2 g/dL (ref 3.5–5.2)
Alkaline Phosphatase: 82 U/L (ref 39–117)
BUN: 21 mg/dL (ref 6–23)
CO2: 32 meq/L (ref 19–32)
Calcium: 9.2 mg/dL (ref 8.4–10.5)
Chloride: 102 meq/L (ref 96–112)
Creatinine, Ser: 0.95 mg/dL (ref 0.40–1.20)
GFR: 52.61 mL/min — ABNORMAL LOW (ref >60.00)
Glucose, Bld: 99 mg/dL (ref 70–99)
Potassium: 4.5 meq/L (ref 3.5–5.1)
Sodium: 142 meq/L (ref 135–145)
Total Bilirubin: 0.5 mg/dL (ref 0.2–1.2)
Total Protein: 6.4 g/dL (ref 6.0–8.3)

## 2023-05-16 LAB — LIPID PANEL
Cholesterol: 227 mg/dL — ABNORMAL HIGH (ref 0–200)
HDL: 70.5 mg/dL (ref >39.00)
LDL Cholesterol: 143 mg/dL — ABNORMAL HIGH (ref 0–99)
NonHDL: 156.26
Total CHOL/HDL Ratio: 3
Triglycerides: 64 mg/dL (ref 0.0–149.0)
VLDL: 12.8 mg/dL (ref 0.0–40.0)

## 2023-05-16 LAB — TSH: TSH: 4.22 u[IU]/mL (ref 0.35–5.50)

## 2023-05-16 LAB — VITAMIN B12: Vitamin B-12: 699 pg/mL (ref 211–911)

## 2023-05-16 LAB — VITAMIN D 25 HYDROXY (VIT D DEFICIENCY, FRACTURES): VITD: 94.98 ng/mL (ref 30.00–100.00)

## 2023-05-16 NOTE — Telephone Encounter (Signed)
 During appointment today, daughter, Diannia Ruder, had questions about patient's medical records.  I discussed that I would give our administration this information and ask them to reach out to her.  Lelon Mast

## 2023-05-16 NOTE — Progress Notes (Addendum)
 Mariah Bryant is a 89 y.o. female here for a new problem.  History of Present Illness:   Chief Complaint  Patient presents with   Breast Lesion    Pt c/o lesion on right breast x 3 - 4 weeks. Denies pain.    HPI - Pt is accompanied by her daughter.   Breast abnormality Pt complains of a lesion on her right breast x3-4 weeks.  Has not tried to pick it off. Does endorse some soreness to touch.  Last mammogram was in 04/2021; done at [facility].  Denies any pain.  Memory issues: Pt reports concerns with her memory, stating she has been more forgetful recently.  Struggles to remember things she previously would know such as birthdays.  Also forgets where she has left items, states she will walk into a room multiple times before finally remembering what she was looking for and finding it.  States her "mind is not clear".  She is able to manage her own finances and reviews her own bills.  Her daughter reports she does assist her with some of her finances, mostly when she has to make calls due to Tiki's hearing impairments.  She is able to read and remember what she read.  Has tried playing brain games to "keep her mind sharp". Has not previously forgotten to turn the stove or any faucets off. No fmhx of dementia.  She reports her mother lived until mid-late 63's with no dementia.   Tremor Her daughter reports a fall where pt injured her buttocks, back, and head.  Has been seen by neurology but daughter estimates that this was > 10 years ago. In the past a chiropractor and masseuse in the past has helped with her tremor.  Has not been dx with Parkinson's Feels like her tremor is getting worse and would like evaluation for this.  Moods: Has not been taking Xanax 0.25 mg. Continues on Lexapro 10 mg daily States she has not needed to take it.  No acute concerns reported today.   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   Chronic pain of left knee 08/16/2017   Depressive disorder 07/17/2009   Gastro-esophageal reflux disease without esophagitis 10/07/2010   Hiatal hernia 10/07/2010   Hypercholesterolemia 07/17/2009   Hypogammaglobulinemia (HCC) 08/15/2017   Macular degeneration 08/16/2017   Orthostatic intolerance 08/16/2017   Osteoarthritis 07/17/2009   Osteoporosis 08/16/2017   Seborrheic dermatitis 10/07/2010   Sleep apnea 07/17/2009   Temporomandibular joint disorder 10/07/2010     Social History   Tobacco Use   Smoking status: Never   Smokeless tobacco: Never  Vaping Use   Vaping status: Never Used  Substance Use Topics   Alcohol use: Yes    Alcohol/week: 1.0 standard drink of alcohol    Types: 1 Glasses of wine per week    Comment: occasionally   Drug use: Never    Past Surgical History:  Procedure Laterality Date   APPENDECTOMY     CATARACT EXTRACTION, BILATERAL     CHOLECYSTECTOMY     HYSTERECTOMY ABDOMINAL WITH SALPINGO-OOPHORECTOMY     REPLACEMENT TOTAL KNEE      Family History  Problem Relation Age of Onset   Breast cancer Mother 84   Stroke Mother    Arthritis Mother  Irregular heart beat Father    Cancer Sister    Congestive Heart Failure Paternal Grandmother    Cancer Brother    Breast cancer Niece 80    Allergies  Allergen Reactions   Amoxicillin Rash   Epinephrine Palpitations   Shellfish Allergy Anaphylaxis   Morphine And Codeine Other (See Comments)    Irregular heart beat   Atarax [Hydroxyzine] Anxiety   Biaxin [Clarithromycin] Rash   Codeine Palpitations   Doxycycline Rash   Novocain [Procaine] Palpitations   Penicillins Rash    Current Medications:   Current Outpatient Medications:    albuterol (VENTOLIN HFA) 108 (90 Base) MCG/ACT inhaler, Inhale 2 puffs into the lungs every 6 (six) hours as needed for  wheezing or shortness of breath. For current cough, use at least in am and before bedtime until resolved then prn, Disp: 18 g, Rfl: 0   amiodarone (PACERONE) 200 MG tablet, Take 1 tablet (200 mg total) by mouth daily. Call and make appt to keep getting refills. [phone] attempt., Disp: 30 tablet, Rfl: 0   apixaban (ELIQUIS) 5 MG TABS tablet, Take 1 tablet (5 mg total) by mouth 2 (two) times daily. NEEDS CARDIOLOGY APPT FOR REFILLS, CALL OFFICE, Disp: 60 tablet, Rfl: 0   Cholecalciferol 25 MCG (1000 UT) tablet, Take by mouth daily., Disp: , Rfl:    escitalopram (LEXAPRO) 10 MG tablet, TAKE 1 TABLET BY MOUTH EVERY DAY, Disp: 90 tablet, Rfl: 1   fluticasone (FLONASE) 50 MCG/ACT nasal spray, Place 2 sprays into both nostrils 2 (two) times daily as needed for allergies or rhinitis., Disp: , Rfl:    furosemide (LASIX) 20 MG tablet, TAKE 1 TABLET BY MOUTH EVERY DAY, Disp: 30 tablet, Rfl: 0   Multiple Vitamins-Minerals (PRESERVISION AREDS 2 PO), Take 1 tablet by mouth 2 (two) times daily., Disp: , Rfl:    omeprazole (PRILOSEC) 20 MG capsule, Take 20 mg by mouth daily., Disp: , Rfl:    Polyethyl Glycol-Propyl Glycol (SYSTANE) 0.4-0.3 % SOLN, Apply 1 drop to eye at bedtime., Disp: , Rfl:    ALPRAZolam (XANAX) 0.25 MG tablet, Take 1 tablet nightly. (Patient not taking: Reported on 05/16/2023), Disp: 90 tablet, Rfl: 0   budesonide (PULMICORT) 180 MCG/ACT inhaler, Take 2 puffs by mouth 2 (two) times daily as needed. (Patient not taking: Reported on 05/16/2023), Disp: , Rfl:    denosumab (PROLIA) 60 MG/ML SOSY injection, Inject 60 mg into the skin every 6 (six) months. (Patient not taking: Reported on 05/16/2023), Disp: 180 mL, Rfl: 1   Review of Systems:   Negative unless otherwise specified per HPI.  Vitals:   Vitals:   05/16/23 1157  BP: 110/60  Pulse: (!) 59  Temp: 98.4 F (36.9 C)  TempSrc: Temporal  SpO2: 95%  Weight: 135 lb (61.2 kg)  Height: 5' (1.524 m)     Body mass index is 26.37  kg/m.  Physical Exam:   Physical Exam Vitals and nursing note reviewed.  Constitutional:      General: She is not in acute distress.    Appearance: She is well-developed. She is not ill-appearing or toxic-appearing.  Cardiovascular:     Rate and Rhythm: Normal rate and regular rhythm.     Pulses: Normal pulses.     Heart sounds: Normal heart sounds, S1 normal and S2 normal.  Pulmonary:     Effort: Pulmonary effort is normal.     Breath sounds: Normal breath sounds.  Chest:     Comments: Right nipple with central  crusting Tenderness to palpation to right upper breast Skin:    General: Skin is warm and dry.  Neurological:     Mental Status: She is alert.     GCS: GCS eye subscore is 4. GCS verbal subscore is 5. GCS motor subscore is 6.     Motor: Tremor present.  Psychiatric:        Speech: Speech normal.        Behavior: Behavior normal. Behavior is cooperative.     Assessment and Plan:   1. Osteoporosis without current pathological fracture, unspecified osteoporosis type (Primary) Will reorder DEXA and restart Prolia if indicated Update vitamin D as well Continue efforts at calcium and vitamin D - CBC with Differential/Platelet - Comprehensive metabolic panel - VITAMIN D 25 Hydroxy (Vit-D Deficiency, Fractures) - DG Bone Density; Future  2. Essential tremor Will update blood work Daughter reports it has been several years since this has been evaluated Will refer to neurology for further evaluation - CBC with Differential/Platelet - TSH  3. Concern about memory Update blood work Refer to neurology - CBC with Differential/Platelet - Vitamin B12 - TSH  4. Breast pain Update mammogram - MM 3D DIAGNOSTIC MAMMOGRAM BILATERAL BREAST; Future  5. Hypercholesterolemia, no statin Update lipid panel today and provide recommendations  - Lipid panel  6. Depressive disorder, on Lexapro Well controlled Continue Lexapro 10 mg daily Follow up in 3-6 month(s) for  Comprehensive Physical Exam (CPE) preventive care annual visit, sooner if concerns - CBC with Differential/Platelet - TSH  7. Encounter for immunization - Flu Vaccine Trivalent High Dose (Fluad)  8. Need for prophylactic vaccination against Streptococcus pneumoniae (pneumococcus) - Pneumococcal conjugate vaccine 20-valent    I, Isabelle Course, acting as a Neurosurgeon for [employer], [place]., have documented all relevant documentation on the behalf of Jarold Motto, [place], as directed by  Jarold Motto, PA while in the presence of Jarold Motto, [place].  I, Jarold Motto, [place], have reviewed all documentation for this visit. The documentation on 05/16/23 for the exam, diagnosis, procedures, and orders are all accurate and complete.  Jarold Motto, PA-C

## 2023-05-16 NOTE — Telephone Encounter (Signed)
 Mariah Bryant, we can not do Prolia injection. She has not had one since 2023 and PA has not been done or a recent Dexa Scan. Will discuss with patient when I see her.

## 2023-05-16 NOTE — Patient Instructions (Signed)
 It was great to see you!  You will be contacted about scheduling mammogram   Please schedule bone density scan on the way out of the office today  We will update blood work  We will refer to neurology  Let's follow-up in 3-6 months for Comprehensive Physical Exam (CPE) preventive care annual visit, sooner if you have concerns.  Take care,  Jarold Motto PA-C

## 2023-05-16 NOTE — Telephone Encounter (Signed)
 Pt's daughter is requesting Prolia Inj. Do not see a PA for this as of this year or 2024. Please advise

## 2023-05-17 ENCOUNTER — Ambulatory Visit (INDEPENDENT_AMBULATORY_CARE_PROVIDER_SITE_OTHER)
Admission: RE | Admit: 2023-05-17 | Discharge: 2023-05-17 | Disposition: A | Discharge status: Home / Self Care | Source: Ambulatory Visit | Attending: Physician Assistant | Admitting: Physician Assistant

## 2023-05-17 ENCOUNTER — Encounter: Payer: Self-pay | Admitting: Neurology

## 2023-05-17 DIAGNOSIS — M810 Age-related osteoporosis without current pathological fracture: Secondary | ICD-10-CM

## 2023-05-18 ENCOUNTER — Other Ambulatory Visit: Payer: Self-pay | Admitting: *Deleted

## 2023-05-18 DIAGNOSIS — M810 Age-related osteoporosis without current pathological fracture: Secondary | ICD-10-CM

## 2023-05-18 MED ORDER — DENOSUMAB 60 MG/ML ~~LOC~~ SOSY: 60.0000 mg | PREFILLED_SYRINGE | Freq: Once | SUBCUTANEOUS | Status: AC

## 2023-05-20 NOTE — Telephone Encounter (Signed)
Spoke with in regard

## 2023-05-23 NOTE — Progress Notes (Unsigned)
 Assessment/Plan:   1.  Essential Tremor.  -This is evidenced by the symmetrical nature, fam hx and longstanding hx of gradually getting worse.  We discussed nature and pathophysiology.  We discussed that this can continue to gradually get worse with time.  We discussed that some medications can worsen this, as can caffeine use.  We discussed medication therapy as well as surgical therapy.  Unfortunately, she is not a candidate for primidone given the Eliquis.  She is really not a candidate for beta-blocker therapy given low blood pressure.  Dr. Jimmey Ralph mention she tried this some years ago and it caused dizziness.  In addition, I do not think that medications are going to make a significant difference in this degree of tremor.  Second line medications are likely not an option given anticholinergic side effects and many of these.  Ultimately, we discussed that the risk: Benefit ratio was just not in her favor.  -Patient is on amiodarone, which can cause tremor and up to one third of patients who are on it.  She is going to discuss this with her cardiologist.  2.  Memory loss  -I do think that some of this is because of poor hearing, and that she is not encoding information well.  If she has dementia, it is probably mild in nature.  We had planned to assess this today, but unfortunately she showed up pretty late for her appointment, and we just did not have time to go through a complete memory examination.  We decided to go ahead and start the MRI of the brain and then she will follow-up with our PA.  Patient is living with her daughter full-time. Subjective:   Mariah Bryant was seen today in the movement disorders clinic for neurologic consultation at the request of Jarold Motto, [place].  The consultation is for the evaluation of essential tremor and memory change.  She was apparently evaluated for tremor elsewhere by a neurologist over a decade ago, but those notes are not available to me.  Notes  from Dr. Jimmey Ralph back in 2022 indicated that he was going to try primidone, but patient called back with concerns about contraindication with Eliquis and it was not ever trialed.  I do see that Dr. Graciela Husbands had considered that Eliquis may even be causing the tremor and he was planning on changing her to Xarelto to see how she did, but I do not see that she has seen Dr. Graciela Husbands since that time (and that was 2022).  It appears that she is seeing cardiology now out of [place] (as she lives there some of the year).  Tremor: Yes.   , present since 1998 per daughter  At rest or with activation?  Rest and activation  Fam hx of tremor?  Yes.  , dad had essential tremor but no other family members  Located where?  L>R hand  Affected by caffeine:  Yes.    Affected by alcohol:  No.  Affected by stress:  Yes.    Affected by fatigue: "it slows it down"  Spills soup if on spoon:  Yes.    Spills glass of liquid if full:  Yes.  , takes 2 hands and doesn't fill it full and uses heavy cup  Other Specific Symptoms:    Postural symptoms:  "my balance isn't bad but I don't see well so that can affect my balance"  Falls?  No falls in 2 years Bradykinesia symptoms: no bradykinesia noted Loss of smell:  No. Loss of taste:  No. Urinary Incontinence:  Yes.  , wears pull up Difficulty Swallowing:  No. Trouble with ADL's:  No.  Trouble buttoning clothing: No. Memory changes:  Yes.  ;  Still manages own finances, but her daughter does assist her some with those.  Patient is concerned that she will sometimes forget things that she thinks she should remember.  She lives full-time with her daughter.  She also lives some in [place] with other family members and receives care in [place]. Hallucinations:  No.  visual distortions: No. N/V:  No. Lightheaded:  Yes.  , if gets up quick her BP will drop (BP stays low)  Syncope: No. Diplopia:  No. Dyskinesia:  No.  Neuroimaging of the brain has not previously  been performed.    ALLERGIES:   Allergies  Allergen Reactions   Amoxicillin Rash   Epinephrine Palpitations   Shellfish Allergy Anaphylaxis   Morphine And Codeine Other (See Comments)    Irregular heart beat   Atarax [Hydroxyzine] Anxiety   Biaxin [Clarithromycin] Rash   Codeine Palpitations   Doxycycline Rash   Novocain [Procaine] Palpitations   Penicillins Rash    CURRENT MEDICATIONS:  Current Outpatient Medications  Medication Instructions   albuterol (VENTOLIN HFA) 108 (90 Base) MCG/ACT inhaler 2 puffs, Inhalation, Every 6 hours PRN, For current cough, use at least in am and before bedtime until resolved then prn   ALPRAZolam (XANAX) 0.25 MG tablet Take 1 tablet nightly.   amiodarone (PACERONE) 200 mg, Oral, Daily, Call and make appt to keep getting refills. [phone] attempt.   apixaban (ELIQUIS) 5 mg, Oral, 2 times daily, NEEDS CARDIOLOGY APPT FOR REFILLS, CALL OFFICE   budesonide (PULMICORT) 180 MCG/ACT inhaler 2 puffs, 2 times daily PRN   Cholecalciferol 25 MCG (1000 UT) tablet Daily   denosumab (PROLIA) 60 mg, Subcutaneous, Every 6 months   escitalopram (LEXAPRO) 10 mg, Oral, Daily   fluticasone (FLONASE) 50 MCG/ACT nasal spray 2 sprays, 2 times daily PRN   furosemide (LASIX) 20 MG tablet TAKE 1 TABLET BY MOUTH EVERY DAY   Multiple Vitamins-Minerals (PRESERVISION AREDS 2 PO) 1 tablet, 2 times daily   omeprazole (PRILOSEC) 20 mg, Daily   Polyethyl Glycol-Propyl Glycol (SYSTANE) 0.4-0.3 % SOLN 1 drop, Nightly    Objective:   PHYSICAL EXAMINATION:    VITALS:   Vitals:   05/25/23 0855  BP: 110/70  Pulse: 70  Resp: 20  SpO2: 98%  Weight: 134 lb (60.8 kg)  Height: 5' (1.524 m)    GEN:  The patient appears stated age and is in NAD. HEENT:  Normocephalic, atraumatic.  The mucous membranes are moist. The superficial temporal arteries are without ropiness or tenderness. CV:  RRR Lungs:  CTAB Neck/HEME:  There are no carotid bruits  bilaterally.  Neurological examination:  Orientation: The patient is alert and oriented x3.  She is able to adequately provide her history and answers questions appropriately (as long as she hears them).  She was scheduled for [facility] appt prior to visit with me but unfortunately she missed that appt (25 min late). Cranial nerves: There is good facial symmetry.  Extraocular muscles are intact. The visual fields are full to confrontational testing.  Visual acuity is poor.  The speech is fluent and clear.  She has some vocal tremor.  Soft palate rises symmetrically and there is no tongue deviation. Hearing is decreased to conversational tone. Sensation: Sensation is intact to light touch throughout (facial, trunk, extremities). Vibration is intact at  the bilateral big toe. There is no extinction with double simultaneous stimulation.  Motor: Strength is 5/5 in the bilateral upper and lower extremities.   Shoulder shrug is equal and symmetric.  There is no pronator drift. Deep tendon reflexes: Deep tendon reflexes are 2-/4 at the bilateral biceps, triceps, brachioradialis, patella and achilles. Plantar responses are downgoing bilaterally.  Movement examination: Tone: There is normal tone in the bilateral upper extremities.  The tone in the lower extremities is normal.  Abnormal movements: there is jaw tremor.  There is left upper extremity rest tremor greater than right upper extremity rest tremor.  There is mild postural tremor that becomes moderate to severe with intention tremor.  The left is worse than the right.  She has significant trouble getting the pen to the paper for drawing Archimedes spirals on the left.     Coordination:  There is no decremation with RAM's, with any form of RAMS, including alternating supination and pronation of the forearm, hand opening and closing, finger taps, heel taps and toe taps.  Gait and Station: The patient pushes off to arise.  She ambulates well in the hall with  good armswing. I have reviewed and interpreted the following labs independently   Chemistry      Component Value Date/Time   NA 142 05/16/2023 1243   NA 141 04/29/2020 1220   K 4.5 05/16/2023 1243   CL 102 05/16/2023 1243   CO2 32 05/16/2023 1243   BUN 21 05/16/2023 1243   BUN 13 04/29/2020 1220   CREATININE 0.95 05/16/2023 1243   CREATININE 0.73 11/29/2019 1200      Component Value Date/Time   CALCIUM 9.2 05/16/2023 1243   ALKPHOS 82 05/16/2023 1243   AST 15 05/16/2023 1243   ALT 14 05/16/2023 1243   BILITOT 0.5 05/16/2023 1243   BILITOT 0.4 04/29/2020 1220      Lab Results  Component Value Date   TSH 4.22 05/16/2023   Lab Results  Component Value Date   WBC 7.0 05/16/2023   HGB 14.6 05/16/2023   HCT 44.4 05/16/2023   MCV 91.1 05/16/2023   PLT 248.0 05/16/2023      Total time spent on today's visit was 60 minutes, including both face-to-face time and nonface-to-face time.  Time included that spent on review of records (prior notes available to me/labs/imaging if pertinent), discussing treatment and goals, answering patient's questions and coordinating care.  Cc:  Jarold Motto, PA

## 2023-05-25 ENCOUNTER — Ambulatory Visit (INDEPENDENT_AMBULATORY_CARE_PROVIDER_SITE_OTHER): Admitting: Neurology

## 2023-05-25 ENCOUNTER — Ambulatory Visit

## 2023-05-25 ENCOUNTER — Encounter: Payer: Self-pay | Admitting: Neurology

## 2023-05-25 VITALS — BP 110/70 | HR 70 | Resp 20 | Ht 60.0 in | Wt 134.0 lb

## 2023-05-25 DIAGNOSIS — G250 Essential tremor: Secondary | ICD-10-CM

## 2023-05-25 DIAGNOSIS — R413 Other amnesia: Secondary | ICD-10-CM

## 2023-05-25 NOTE — Patient Instructions (Signed)
 We discussed that amiodarone can worsen tremor and you can discuss this with the cardiologist.  This is likely not the sole source of tremor.  A referral to [facility] has been placed for your MRI someone will contact you directly to schedule your appt. They are located at [address] [facility]. Please contact them directly by calling [phone] with any questions regarding your referral.

## 2023-05-26 ENCOUNTER — Other Ambulatory Visit

## 2023-05-26 ENCOUNTER — Encounter

## 2023-05-26 ENCOUNTER — Telehealth: Payer: Self-pay | Admitting: Physician Assistant

## 2023-05-26 NOTE — Telephone Encounter (Signed)
 Patient's daughter states patient is scheduled for breast US in April but was told that this was an urgent matter. States she wants to know if they could be scheduled for a soon appointment. Is it possible to make the imaging urgent so she can get in quicker?

## 2023-05-27 NOTE — Telephone Encounter (Signed)
 Spoke to pt's daughter Diannia Ruder, told her Lelon Mast changed orders to STAT and I sent them to [facility], someone will contact you to schedule. Diannia Ruder verbalized understanding.

## 2023-05-27 NOTE — Addendum Note (Signed)
 Addended by: Jimmye Norman on: 05/27/2023 12:31 PM   Modules accepted: Orders

## 2023-05-27 NOTE — Telephone Encounter (Signed)
 Orders faxed to [facility] at [phone].

## 2023-05-27 NOTE — Telephone Encounter (Signed)
 Please see message.

## 2023-06-08 ENCOUNTER — Ambulatory Visit
Admission: RE | Admit: 2023-06-08 | Discharge: 2023-06-08 | Disposition: A | Discharge status: Home / Self Care | Source: Ambulatory Visit | Attending: Physician Assistant | Admitting: Physician Assistant

## 2023-06-08 DIAGNOSIS — N644 Mastodynia: Secondary | ICD-10-CM

## 2023-06-08 DIAGNOSIS — N6459 Other signs and symptoms in breast: Secondary | ICD-10-CM | POA: Diagnosis not present

## 2023-06-15 NOTE — Telephone Encounter (Unsigned)
 Copied from CRM [id number]. Topic: Appointments - Appointment Scheduling >> Jun 15, 2023  1:36 PM Rosaria Common wrote: Patient/patient representative is calling to schedule an appointment. Refer to attachments for appointment information.

## 2023-06-16 ENCOUNTER — Telehealth: Payer: Self-pay | Admitting: Physician Assistant

## 2023-06-16 NOTE — Telephone Encounter (Signed)
 Patient's daughter came in inquiring about patient's prolia. States patient is set to leave for [place]  soon and she would like this to be done ASAP. Please advise.

## 2023-06-17 ENCOUNTER — Telehealth: Payer: Self-pay

## 2023-06-17 NOTE — Telephone Encounter (Signed)
 Prolia VOB initiated via [email or web address]

## 2023-06-17 NOTE — Telephone Encounter (Signed)
 Benefit verification started. Will update referral once complete.

## 2023-06-20 ENCOUNTER — Other Ambulatory Visit (HOSPITAL_COMMUNITY): Payer: Self-pay

## 2023-06-20 NOTE — Telephone Encounter (Signed)
 Mariah Bryant

## 2023-06-20 NOTE — Telephone Encounter (Signed)
 Noted.

## 2023-06-21 ENCOUNTER — Other Ambulatory Visit (HOSPITAL_COMMUNITY): Payer: Self-pay

## 2023-06-21 ENCOUNTER — Ambulatory Visit

## 2023-06-21 ENCOUNTER — Ambulatory Visit: Admitting: Physician Assistant

## 2023-06-21 NOTE — Telephone Encounter (Signed)
 Called [employer] to initiate PA. Per representative no PA is required since patient has Medicare as primary.   Ref#: [id number]

## 2023-06-21 NOTE — Telephone Encounter (Signed)
 Pt ready for scheduling for PROLIA  on or after : 06/21/23  Option# 1: Buy/Bill (Office supplied medication)  Out-of-pocket cost due at time of clinic visit: $25  Number of injection/visits approved: ---  Primary: MEDICARE Prolia  co-insurance: 0% Admin fee co-insurance: 20%  Secondary: [employer] Prolia  co-insurance:  Admin fee co-insurance:   Medical Benefit Details: Date Benefits were checked: 06/20/23 Deductible: $257 Met of $257 Required/ Coinsurance: 0%/ Admin Fee: 20%  Prior Auth: N/A PA# Expiration Date:   # of doses approved: ----------------------------------------------------------------------- Option# 2- Med Obtained from pharmacy:  Pharmacy benefit: Copay $90 (Paid to pharmacy) Admin Fee: 20% (Pay at clinic)  Prior Auth: N/A PA# Expiration Date:   # of doses approved:   If patient wants fill through the pharmacy benefit please send prescription to:  [facility] , and include estimated need by date in rx notes. Pharmacy will ship medication directly to the office.  Patient NOT eligible for Prolia  Copay Card. Copay Card can make patient's cost as little as $25. Link to apply: [email or web address]://www.[email or web address]  ** This summary of benefits is an estimation of the patient's out-of-pocket cost. Exact cost may very based on individual plan coverage.

## 2023-06-22 ENCOUNTER — Encounter

## 2023-06-22 ENCOUNTER — Other Ambulatory Visit

## 2023-06-24 ENCOUNTER — Ambulatory Visit

## 2023-06-24 ENCOUNTER — Ambulatory Visit: Admitting: Physician Assistant

## 2023-07-06 DIAGNOSIS — H353231 Exudative age-related macular degeneration, bilateral, with active choroidal neovascularization: Secondary | ICD-10-CM | POA: Diagnosis not present

## 2023-07-12 ENCOUNTER — Ambulatory Visit: Admitting: Physician Assistant

## 2023-07-12 ENCOUNTER — Ambulatory Visit

## 2023-08-06 DIAGNOSIS — I4891 Unspecified atrial fibrillation: Secondary | ICD-10-CM | POA: Diagnosis not present

## 2023-08-06 DIAGNOSIS — R002 Palpitations: Secondary | ICD-10-CM | POA: Diagnosis not present

## 2023-08-16 DIAGNOSIS — L84 Corns and callosities: Secondary | ICD-10-CM | POA: Diagnosis not present

## 2023-08-16 DIAGNOSIS — I7091 Generalized atherosclerosis: Secondary | ICD-10-CM | POA: Diagnosis not present

## 2023-08-16 DIAGNOSIS — L603 Nail dystrophy: Secondary | ICD-10-CM | POA: Diagnosis not present

## 2023-08-17 DIAGNOSIS — H353231 Exudative age-related macular degeneration, bilateral, with active choroidal neovascularization: Secondary | ICD-10-CM | POA: Diagnosis not present

## 2023-08-18 DIAGNOSIS — G250 Essential tremor: Secondary | ICD-10-CM | POA: Diagnosis not present

## 2023-08-18 DIAGNOSIS — I480 Paroxysmal atrial fibrillation: Secondary | ICD-10-CM | POA: Diagnosis not present

## 2023-08-18 DIAGNOSIS — I951 Orthostatic hypotension: Secondary | ICD-10-CM | POA: Diagnosis not present

## 2023-08-18 DIAGNOSIS — I495 Sick sinus syndrome: Secondary | ICD-10-CM | POA: Diagnosis not present

## 2023-08-18 DIAGNOSIS — E785 Hyperlipidemia, unspecified: Secondary | ICD-10-CM | POA: Diagnosis not present

## 2023-08-18 DIAGNOSIS — I5032 Chronic diastolic (congestive) heart failure: Secondary | ICD-10-CM | POA: Diagnosis not present

## 2023-09-01 DIAGNOSIS — H01004 Unspecified blepharitis left upper eyelid: Secondary | ICD-10-CM | POA: Diagnosis not present

## 2023-09-01 DIAGNOSIS — H01001 Unspecified blepharitis right upper eyelid: Secondary | ICD-10-CM | POA: Diagnosis not present

## 2023-09-01 DIAGNOSIS — H43313 Vitreous membranes and strands, bilateral: Secondary | ICD-10-CM | POA: Diagnosis not present

## 2023-09-23 DIAGNOSIS — D801 Nonfamilial hypogammaglobulinemia: Secondary | ICD-10-CM | POA: Diagnosis not present

## 2023-09-23 DIAGNOSIS — R251 Tremor, unspecified: Secondary | ICD-10-CM | POA: Diagnosis not present

## 2023-09-23 DIAGNOSIS — I480 Paroxysmal atrial fibrillation: Secondary | ICD-10-CM | POA: Diagnosis not present

## 2023-09-23 DIAGNOSIS — K219 Gastro-esophageal reflux disease without esophagitis: Secondary | ICD-10-CM | POA: Diagnosis not present

## 2023-09-23 DIAGNOSIS — I5020 Unspecified systolic (congestive) heart failure: Secondary | ICD-10-CM | POA: Diagnosis not present

## 2023-10-03 DIAGNOSIS — K2100 Gastro-esophageal reflux disease with esophagitis, without bleeding: Secondary | ICD-10-CM | POA: Diagnosis not present

## 2023-10-03 DIAGNOSIS — I480 Paroxysmal atrial fibrillation: Secondary | ICD-10-CM | POA: Diagnosis not present

## 2023-10-03 DIAGNOSIS — Z0000 Encounter for general adult medical examination without abnormal findings: Secondary | ICD-10-CM | POA: Diagnosis not present

## 2023-10-03 DIAGNOSIS — R251 Tremor, unspecified: Secondary | ICD-10-CM | POA: Diagnosis not present

## 2023-10-03 DIAGNOSIS — H3530 Unspecified macular degeneration: Secondary | ICD-10-CM | POA: Diagnosis not present

## 2023-10-03 NOTE — Telephone Encounter (Signed)
 Needs updated VOB   Prolia  VOB initiated via [email or web address]  Next Prolia  inj DUE:

## 2023-10-12 DIAGNOSIS — H353231 Exudative age-related macular degeneration, bilateral, with active choroidal neovascularization: Secondary | ICD-10-CM | POA: Diagnosis not present

## 2023-10-13 DIAGNOSIS — M1612 Unilateral primary osteoarthritis, left hip: Secondary | ICD-10-CM | POA: Diagnosis not present

## 2023-10-13 DIAGNOSIS — M25552 Pain in left hip: Secondary | ICD-10-CM | POA: Diagnosis not present

## 2023-10-15 NOTE — Telephone Encounter (Signed)
 Prolia  VOB initiated via [email or web address]  Next Prolia  inj DUE: re-start

## 2023-10-18 DIAGNOSIS — I7091 Generalized atherosclerosis: Secondary | ICD-10-CM | POA: Diagnosis not present

## 2023-10-18 DIAGNOSIS — L84 Corns and callosities: Secondary | ICD-10-CM | POA: Diagnosis not present

## 2023-10-18 DIAGNOSIS — L603 Nail dystrophy: Secondary | ICD-10-CM | POA: Diagnosis not present

## 2023-10-18 NOTE — Telephone Encounter (Signed)
VOB still pending

## 2023-11-01 NOTE — Telephone Encounter (Signed)
 Medical Buy and Bill  Secondary - Prior Authorization REQUIRED for PROLIA   PA PROCESS DETAILS: PA is required and is currently not on file. Please call Medical Review at [phone] to initiate PA.

## 2023-11-14 ENCOUNTER — Ambulatory Visit: Admitting: Physician Assistant

## 2023-11-14 ENCOUNTER — Ambulatory Visit: Admitting: Family Medicine

## 2023-11-29 ENCOUNTER — Other Ambulatory Visit (HOSPITAL_COMMUNITY): Payer: Self-pay

## 2023-11-29 NOTE — Telephone Encounter (Signed)
 Pt ready for scheduling for PROLIA  on or after : 11/29/23  Option# 1: Buy/Bill (Office supplied medication)  Out-of-pocket cost due at time of clinic visit: $0  Number of injection/visits approved: ---  Primary: MEDICARE Prolia  co-insurance: 0% Admin fee co-insurance: 0%  Secondary: [employer] Prolia  co-insurance:  Admin fee co-insurance:   Medical Benefit Details: Date Benefits were checked: 10/19/23 Deductible: $257 Met of $257 Required/ Coinsurance: 0%/ Admin Fee: 0%  Prior Auth: N/A PA# Expiration Date:   # of doses approved: ----------------------------------------------------------------------- Option# 2- Med Obtained from pharmacy:  Pharmacy benefit: Copay $90 (Paid to pharmacy) Admin Fee: 0% (Pay at clinic)  Prior Auth: N/A PA# Expiration Date:   # of doses approved:   If patient wants fill through the pharmacy benefit please send prescription to: [facility], and include estimated need by date in rx notes. Pharmacy will ship medication directly to the office.  Patient NOT eligible for Prolia  Copay Card. Copay Card can make patient's cost as little as $25. Link to apply: https://www.[email or web address]/copay  ** This summary of benefits is an estimation of the patient's out-of-pocket cost. Exact cost may very based on individual plan coverage.

## 2023-11-29 NOTE — Telephone Encounter (Signed)
 Called insurance at [phone]. PA is not required since patient has Medicare as primary insurance.   Ref #: [id number]

## 2023-11-29 NOTE — Telephone Encounter (Signed)
 Please reach out to pt and schedule NV for Prolia . Out of pocket cost $0.

## 2023-11-30 NOTE — Telephone Encounter (Signed)
 Noted

## 2023-12-15 DIAGNOSIS — M1612 Unilateral primary osteoarthritis, left hip: Secondary | ICD-10-CM | POA: Diagnosis not present

## 2023-12-15 DIAGNOSIS — Z23 Encounter for immunization: Secondary | ICD-10-CM | POA: Diagnosis not present

## 2023-12-15 DIAGNOSIS — M1990 Unspecified osteoarthritis, unspecified site: Secondary | ICD-10-CM | POA: Diagnosis not present

## 2023-12-19 DIAGNOSIS — L853 Xerosis cutis: Secondary | ICD-10-CM | POA: Diagnosis not present

## 2023-12-19 DIAGNOSIS — I5032 Chronic diastolic (congestive) heart failure: Secondary | ICD-10-CM | POA: Diagnosis not present

## 2023-12-19 DIAGNOSIS — I480 Paroxysmal atrial fibrillation: Secondary | ICD-10-CM | POA: Diagnosis not present

## 2023-12-20 DIAGNOSIS — L84 Corns and callosities: Secondary | ICD-10-CM | POA: Diagnosis not present

## 2023-12-20 DIAGNOSIS — L603 Nail dystrophy: Secondary | ICD-10-CM | POA: Diagnosis not present

## 2023-12-20 DIAGNOSIS — I7091 Generalized atherosclerosis: Secondary | ICD-10-CM | POA: Diagnosis not present

## 2023-12-21 DIAGNOSIS — I951 Orthostatic hypotension: Secondary | ICD-10-CM | POA: Diagnosis not present

## 2023-12-21 DIAGNOSIS — G250 Essential tremor: Secondary | ICD-10-CM | POA: Diagnosis not present

## 2023-12-21 DIAGNOSIS — I4821 Permanent atrial fibrillation: Secondary | ICD-10-CM | POA: Diagnosis not present

## 2023-12-21 DIAGNOSIS — I495 Sick sinus syndrome: Secondary | ICD-10-CM | POA: Diagnosis not present

## 2023-12-21 DIAGNOSIS — R5383 Other fatigue: Secondary | ICD-10-CM | POA: Diagnosis not present

## 2023-12-21 DIAGNOSIS — I5032 Chronic diastolic (congestive) heart failure: Secondary | ICD-10-CM | POA: Diagnosis not present

## 2023-12-28 DIAGNOSIS — R5383 Other fatigue: Secondary | ICD-10-CM | POA: Diagnosis not present

## 2024-01-06 DIAGNOSIS — J029 Acute pharyngitis, unspecified: Secondary | ICD-10-CM | POA: Diagnosis not present

## 2024-01-06 DIAGNOSIS — R6889 Other general symptoms and signs: Secondary | ICD-10-CM | POA: Diagnosis not present

## 2024-01-12 DIAGNOSIS — J4520 Mild intermittent asthma, uncomplicated: Secondary | ICD-10-CM | POA: Diagnosis not present

## 2024-01-16 DIAGNOSIS — L853 Xerosis cutis: Secondary | ICD-10-CM | POA: Diagnosis not present

## 2024-01-18 DIAGNOSIS — H353231 Exudative age-related macular degeneration, bilateral, with active choroidal neovascularization: Secondary | ICD-10-CM | POA: Diagnosis not present

## 2024-01-23 DIAGNOSIS — I480 Paroxysmal atrial fibrillation: Secondary | ICD-10-CM | POA: Diagnosis not present

## 2024-02-13 DIAGNOSIS — L853 Xerosis cutis: Secondary | ICD-10-CM | POA: Diagnosis not present

## 2024-03-14 ENCOUNTER — Telehealth: Payer: Self-pay | Admitting: *Deleted

## 2024-03-14 NOTE — Telephone Encounter (Signed)
 Copied from CRM [id number]. Topic: Clinical - Medical Advice >> Mar 14, 2024  3:39 PM Deleta RAMAN wrote: Reason for CRM: patient daughter would like to know what to do as far as covid testing. States the house is filled with covid-19 and believes her mom is as well. She would like to know if she should get her something for this as well. Please contact [phone]  Spoke to pt's daughter Saddie, she said her daughter and her have COVID is trying to stay away from pt. Pt tested Neg last night, but is congested. Told her in order to treat pt for anything would have to schedule a Virtual visit.. Told her also to retest pt tomorrow morning. Saddie verbalized understanding and will call back to schedule virtual visit.

## 2024-03-23 ENCOUNTER — Other Ambulatory Visit: Payer: Self-pay | Admitting: Physician Assistant

## 2024-03-29 ENCOUNTER — Ambulatory Visit: Payer: Self-pay

## 2024-03-29 NOTE — Telephone Encounter (Signed)
 FYI Only or Action Required?: FYI only for provider: appointment scheduled on 1/30.  Patient was last seen in primary care on 05/16/2023 by Job Lukes, PA.  Called Nurse Triage reporting Leg Problem.  Symptoms began several months ago.  Interventions attempted: OTC medications: cream.  Symptoms are: unchanged.  Triage Disposition: Home Care  Patient/caregiver understands and will follow disposition?: No, wishes to speak with PCP  Reason for Triage: wound on shin, not getting better, black and blue     Reason for Disposition  Small cut (scratch) or abrasion (scrape) is also present  Answer Assessment - Initial Assessment Questions 1. MECHANISM: How did the injury happen? (e.g., twisting injury, direct blow)      Unsure, has been seen for this before 2. ONSET: When did the injury happen? (e.g., minutes, hours ago)      months 3. LOCATION: Where is the injury located?      shin 4. APPEARANCE of INJURY: What does the injury look like?  (e.g., deformity of leg)     Open wound-size of dime, maybe smaller per call, bruising around open area 5. SEVERITY: Can you put weight on that leg? Can you walk?      Still using leg 7. PAIN: Is there pain? If Yes, ask: How bad is the pain?   What does it keep you from doing? (Scale 0-10; or none, mild, moderate, severe)     Unsure, mother is sleeping 9. OTHER SYMPTOMS: Do you have any other symptoms?      Denies  Protocols used: Leg Injury-A-AH

## 2024-03-29 NOTE — Telephone Encounter (Signed)
 Appt tomorrow

## 2024-03-30 ENCOUNTER — Encounter: Payer: Self-pay | Admitting: Physician Assistant

## 2024-03-30 ENCOUNTER — Ambulatory Visit: Admitting: Physician Assistant

## 2024-03-30 ENCOUNTER — Telehealth: Payer: Self-pay | Admitting: Physician Assistant

## 2024-03-30 VITALS — BP 110/70 | HR 88 | Temp 97.3°F | Ht 61.0 in | Wt 138.2 lb

## 2024-03-30 DIAGNOSIS — I480 Paroxysmal atrial fibrillation: Secondary | ICD-10-CM

## 2024-03-30 DIAGNOSIS — M810 Age-related osteoporosis without current pathological fracture: Secondary | ICD-10-CM

## 2024-03-30 DIAGNOSIS — T148XXA Other injury of unspecified body region, initial encounter: Secondary | ICD-10-CM

## 2024-03-30 MED ORDER — DENOSUMAB 60 MG/ML ~~LOC~~ SOSY: 60.0000 mg | PREFILLED_SYRINGE | Freq: Once | SUBCUTANEOUS | Status: AC

## 2024-03-30 NOTE — Telephone Encounter (Signed)
 Patient would like to restart Prolia .  She is going on a several-months long trip soon and would like this expedited if possible so she can receive prior to leaving.  Mariah Bryant

## 2024-03-30 NOTE — Addendum Note (Signed)
 Addended by: THURMON ARLAND PARAS on: 03/30/2024 03:52 PM   Modules accepted: Orders

## 2024-03-31 LAB — CBC WITH DIFFERENTIAL/PLATELET
Absolute Lymphocytes: 2424 {cells}/uL (ref 850–3900)
Absolute Monocytes: 504 {cells}/uL (ref 200–950)
Basophils Absolute: 102 {cells}/uL (ref 0–200)
Basophils Relative: 1.4 %
Eosinophils Absolute: 102 {cells}/uL (ref 15–500)
Eosinophils Relative: 1.4 %
HCT: 46 % (ref 35.9–46.0)
Hemoglobin: 14.8 g/dL (ref 11.7–15.5)
MCH: 29.2 pg (ref 27.0–33.0)
MCHC: 32.2 g/dL (ref 31.6–35.4)
MCV: 90.7 fL (ref 81.4–101.7)
MPV: 10.8 fL (ref 7.5–12.5)
Monocytes Relative: 6.9 %
Neutro Abs: 4168 {cells}/uL (ref 1500–7800)
Neutrophils Relative %: 57.1 %
Platelets: 253 10*3/uL (ref 140–400)
RBC: 5.07 Million/uL (ref 3.80–5.10)
RDW: 12.8 % (ref 11.0–15.0)
Total Lymphocyte: 33.2 %
WBC: 7.3 10*3/uL (ref 3.8–10.8)

## 2024-03-31 LAB — COMPREHENSIVE METABOLIC PANEL WITH GFR
AG Ratio: 1.8 (calc) (ref 1.0–2.5)
ALT: 12 U/L (ref 6–29)
AST: 15 U/L (ref 10–35)
Albumin: 4.1 g/dL (ref 3.6–5.1)
Alkaline phosphatase (APISO): 83 U/L (ref 37–153)
BUN: 21 mg/dL (ref 7–25)
CO2: 30 mmol/L (ref 20–32)
Calcium: 9.3 mg/dL (ref 8.6–10.4)
Chloride: 103 mmol/L (ref 98–110)
Creat: 0.93 mg/dL (ref 0.60–0.95)
Globulin: 2.3 g/dL (ref 1.9–3.7)
Glucose, Bld: 82 mg/dL (ref 65–99)
Potassium: 4.5 mmol/L (ref 3.5–5.3)
Sodium: 142 mmol/L (ref 135–146)
Total Bilirubin: 0.9 mg/dL (ref 0.2–1.2)
Total Protein: 6.4 g/dL (ref 6.1–8.1)
eGFR: 58 mL/min/{1.73_m2} — ABNORMAL LOW

## 2024-03-31 LAB — VITAMIN D 25 HYDROXY (VIT D DEFICIENCY, FRACTURES): Vit D, 25-Hydroxy: 54 ng/mL (ref 30–100)

## 2024-04-02 ENCOUNTER — Telehealth: Payer: Self-pay

## 2024-04-02 ENCOUNTER — Ambulatory Visit: Payer: Self-pay | Admitting: Physician Assistant

## 2024-04-02 NOTE — Telephone Encounter (Signed)
 Prolia  VOB initiated via [email or web address]  Next Prolia  inj DUE: RESTART

## 2024-04-03 ENCOUNTER — Other Ambulatory Visit: Payer: Self-pay | Admitting: *Deleted

## 2024-04-03 ENCOUNTER — Other Ambulatory Visit (HOSPITAL_COMMUNITY): Payer: Self-pay

## 2024-04-03 DIAGNOSIS — M810 Age-related osteoporosis without current pathological fracture: Secondary | ICD-10-CM

## 2024-04-03 MED ORDER — DENOSUMAB 60 MG/ML ~~LOC~~ SOSY: 60.0000 mg | PREFILLED_SYRINGE | Freq: Once | SUBCUTANEOUS | Status: AC

## 2024-04-03 NOTE — Telephone Encounter (Signed)
 Spoke to Kara, told her Prolia  has been approved, pharmacy benefit is $90 and Rx is being sent to [facility]. Told her as long as she pays for the Prolia  at the pharmacy they will then ship it to us , but we can use one of ours in stock so we can get her in. Told her to call Pharmacy in the morning and then call to schedule a Nurse visit. Saddie verbalized understanding.

## 2024-04-03 NOTE — Telephone Encounter (Signed)
 SABRA

## 2024-04-04 ENCOUNTER — Telehealth: Payer: Self-pay | Admitting: *Deleted

## 2024-04-05 NOTE — Telephone Encounter (Signed)
 SABRA
